# Patient Record
Sex: Female | Born: 1940 | Race: White | Hispanic: No | Marital: Married | Smoking: Never smoker
Health system: Southern US, Community
[De-identification: ages and names within clinical notes are randomized; demographics above are authoritative.]

## PROBLEM LIST (undated history)

## (undated) DIAGNOSIS — J45909 Unspecified asthma, uncomplicated: Secondary | ICD-10-CM

## (undated) DIAGNOSIS — E7800 Pure hypercholesterolemia, unspecified: Secondary | ICD-10-CM

## (undated) DIAGNOSIS — I340 Nonrheumatic mitral (valve) insufficiency: Secondary | ICD-10-CM

## (undated) DIAGNOSIS — I219 Acute myocardial infarction, unspecified: Secondary | ICD-10-CM

## (undated) DIAGNOSIS — I2510 Atherosclerotic heart disease of native coronary artery without angina pectoris: Secondary | ICD-10-CM

## (undated) DIAGNOSIS — M19019 Primary osteoarthritis, unspecified shoulder: Secondary | ICD-10-CM

## (undated) DIAGNOSIS — E1149 Type 2 diabetes mellitus with other diabetic neurological complication: Secondary | ICD-10-CM

## (undated) DIAGNOSIS — G479 Sleep disorder, unspecified: Secondary | ICD-10-CM

## (undated) DIAGNOSIS — F419 Anxiety disorder, unspecified: Secondary | ICD-10-CM

## (undated) DIAGNOSIS — N189 Chronic kidney disease, unspecified: Secondary | ICD-10-CM

## (undated) DIAGNOSIS — I5022 Chronic systolic (congestive) heart failure: Secondary | ICD-10-CM

## (undated) DIAGNOSIS — I63239 Cerebral infarction due to unspecified occlusion or stenosis of unspecified carotid arteries: Secondary | ICD-10-CM

## (undated) DIAGNOSIS — F0390 Unspecified dementia without behavioral disturbance: Secondary | ICD-10-CM

## (undated) DIAGNOSIS — I493 Ventricular premature depolarization: Secondary | ICD-10-CM

## (undated) DIAGNOSIS — I10 Essential (primary) hypertension: Secondary | ICD-10-CM

## (undated) HISTORY — DX: Pure hypercholesterolemia, unspecified: E78.00

## (undated) HISTORY — DX: Ventricular premature depolarization: I49.3

## (undated) HISTORY — DX: Primary osteoarthritis, unspecified shoulder: M19.019

## (undated) HISTORY — DX: Nonrheumatic mitral (valve) insufficiency: I34.0

## (undated) HISTORY — DX: Atherosclerotic heart disease of native coronary artery without angina pectoris: I25.10

## (undated) HISTORY — DX: Unspecified asthma, uncomplicated: J45.909

## (undated) HISTORY — DX: Acute myocardial infarction, unspecified: I21.9

## (undated) HISTORY — DX: Type 2 diabetes mellitus with other diabetic neurological complication: E11.49

## (undated) HISTORY — DX: Anxiety disorder, unspecified: F41.9

## (undated) HISTORY — DX: Essential (primary) hypertension: I10

## (undated) HISTORY — DX: Cerebral infarction due to unspecified occlusion or stenosis of unspecified carotid artery: I63.239

## (undated) HISTORY — DX: Sleep disorder, unspecified: G47.9

## (undated) HISTORY — DX: Chronic kidney disease, unspecified: N18.9

## (undated) HISTORY — PX: TONSILLECTOMY: SUR1361

## (undated) HISTORY — DX: Chronic systolic (congestive) heart failure: I50.22

## (undated) HISTORY — DX: Unspecified dementia, unspecified severity, without behavioral disturbance, psychotic disturbance, mood disturbance, and anxiety: F03.90

---

## 2001-10-13 HISTORY — PX: CARDIAC CATHETERIZATION: SHX172

## 2009-10-13 HISTORY — PX: CARDIAC CATHETERIZATION: SHX172

## 2010-10-13 HISTORY — PX: CARDIAC CATHETERIZATION: SHX172

## 2012-07-27 ENCOUNTER — Encounter: Payer: Self-pay | Admitting: Internal Medicine

## 2012-07-27 ENCOUNTER — Encounter: Payer: Self-pay | Admitting: Cardiology

## 2012-07-27 ENCOUNTER — Ambulatory Visit (INDEPENDENT_AMBULATORY_CARE_PROVIDER_SITE_OTHER): Payer: Medicare Other | Admitting: Internal Medicine

## 2012-07-27 VITALS — BP 131/71 | HR 63 | Ht 66.5 in | Wt 141.4 lb

## 2012-07-27 DIAGNOSIS — F0390 Unspecified dementia without behavioral disturbance: Secondary | ICD-10-CM

## 2012-07-27 DIAGNOSIS — I2510 Atherosclerotic heart disease of native coronary artery without angina pectoris: Secondary | ICD-10-CM | POA: Insufficient documentation

## 2012-07-27 DIAGNOSIS — F0280 Dementia in other diseases classified elsewhere without behavioral disturbance: Secondary | ICD-10-CM | POA: Insufficient documentation

## 2012-07-27 DIAGNOSIS — E119 Type 2 diabetes mellitus without complications: Secondary | ICD-10-CM

## 2012-07-27 MED ORDER — SPIRONOLACTONE 25 MG PO TABS: 12.5000 mg | ORAL_TABLET | Freq: Every day | ORAL | Status: DC

## 2012-07-27 NOTE — Assessment & Plan Note (Signed)
limitig

## 2012-07-27 NOTE — Assessment & Plan Note (Signed)
Stable on current medications. We will decrease her Aldactone from 25>>-12-1/2;; she is already decreased her aspirin to 81

## 2012-07-27 NOTE — Patient Instructions (Signed)
Reduce Aldactone (spironolactone) dose to 12.5mg  (1/2 tablet) daily.  Your physician wants you to follow-up in: 6 months with Dr. Graciela Husbands.  You will receive a reminder letter in the mail two months in advance. If you don't receive a letter, please call our office to schedule the follow-up appointment.

## 2012-07-27 NOTE — Progress Notes (Signed)
History and Physical  Patient ID: [name] Short MRN: [id number], SOB: 05-Jan-1941 71 y.o. Date of Encounter: 07/27/2012, 11:42 AM  Primary Physician: No primary provider on file. Primary Cardiologist New    History of Present Illness: [name] Short is a 71 y.o. female seen to establish cardiac care. She is a history of coronary artery disease with most recent catheterization 12/12 at bedtime with nonobstructive disease prior stenting to diagonal, LAD and posterolateral. These were undertaken apparently 2003 with drug-eluting stenting. Echocardiogram 9/12 demonstrated normal left ventricular function with mild LVH. A Myoview 4/12 had demonstrated "ejection fraction 29%" with abnormalities consistent inferoseptal and apical septal MI. These data are discordant from her echo  Other testing included an abdominal aortic ultrasound 5/12 demonstrated no aneurysm; renal artery ultrasound 9/12  She also has a history of diabetes hypertension with no increase in velocities; carotid ultrasound 5/12 and significant stenosis bilaterally (less than 15 on the right and less than 50 on the left.  She has significant dementia with short-term memory loss. Her husband answered her questions for her. He denies problems with chest pain shortness of breath or edema. She is able to climb a flight of stairs with only minimal difficulty.    Past Medical History  Diagnosis Date  . Hypercholesteremia   . CAD (coronary artery disease)prior stents LAD. Cx Diagonal 2003   . PVC (premature ventricular contraction)   . Occlusion and stenosis of carotid artery with cerebral infarction     <15% R <50% L 2012  . Dyspnea   . Hypertension   . Diabetes mellitus, type II   . Mitral valve disease   . Abnormal EKG   . Fatigue   . Dementia      Past Surgical History  Procedure Date  . Tonsillectomy   . Cardiac catheterization 2003  . Cardiac catheterization 2011  . Cardiac catheterization 2012      Current  Outpatient Prescriptions  Medication Sig Dispense Refill  . ALENDRONATE SODIUM PO Take 1 tablet by mouth once a week.      Marland Kitchen aspirin 81 MG tablet Take 81 mg by mouth daily.      Marland Kitchen atorvastatin (LIPITOR) 40 MG tablet Take 40 mg by mouth daily.      . isosorbide mononitrate (IMDUR) 60 MG 24 hr tablet Take 60 mg by mouth daily.      . memantine (NAMENDA) 10 MG tablet Take 10 mg by mouth 2 (two) times daily.      . metFORMIN (GLUCOPHAGE) 1000 MG tablet Take 1,000 mg by mouth 2 (two) times daily with a meal.      . Methylfol-Methylcob-Acetylcyst (CEREFOLIN NAC PO) Take 1 tablet by mouth daily.      . metoprolol (LOPRESSOR) 100 MG tablet Take 100 mg by mouth 2 (two) times daily.      Marland Kitchen olmesartan-hydrochlorothiazide (BENICAR HCT) 40-25 MG per tablet Take 1 tablet by mouth daily.      Marland Kitchen spironolactone (ALDACTONE) 25 MG tablet Take 25 mg by mouth daily.         Allergies: Allergies  Allergen Reactions  . Ciprofloxacin Rash     History  Substance Use Topics  . Smoking status: Never Smoker   . Smokeless tobacco: Never Used  . Alcohol Use: Yes     Wine occcassionally      Family History  Problem Relation Age of Onset  . Other Mother     Diabetes Mellitus  . Cancer Father   . Coronary artery disease  Other       ROS:  Please see the history of present illness.   All other systems reviewed and negative.   Vital Signs: Blood pressure 131/71, pulse 63, height 5' 6.5" (1.689 m), weight 141 lb 6.4 oz (64.139 kg).  PHYSICAL EXAM: General:  Well nourished, well developed female in no acute distress HEENT: normal Lymph: no adenopathy Neck: no JVD Endocrine:  No thryomegaly Vascular: No carotid bruits; FA pulses 2+ bilaterally without bruits Cardiac:  normal S1, S2; RRR; no murmur Back: without kyphosis/scoliosis, no CVA tenderness Lungs:  clear to auscultation bilaterally, no wheezing, rhonchi or rales Abd: soft, nontender, no hepatomegaly Ext: no edema Musculoskeletal:  No  deformities, BUE and BLE strength normal and equal Skin: warm and dry Neuro:  CNs 2-12 intact, no focal abnormalities noted Psych:  Normal affect   EKG:  Sinus rhythm at 64 intervals 20/14/45 left bundle branch block left axis deviation Labs:      ASSESSMENT AND PLAN:

## 2012-08-18 ENCOUNTER — Telehealth: Payer: Self-pay | Admitting: Internal Medicine

## 2012-08-18 NOTE — Telephone Encounter (Signed)
Pt needs new rx for spirolactone, dosage was reduced to 2.5 mg and tabs she has now are too small to cut in half, uses [facility] in [place]

## 2012-08-18 NOTE — Telephone Encounter (Signed)
Called the pharmacy, spoke with the pharmacist stated the lowest dose is 25 MG, and they are not allowed to split the pills for the pt.  He stated the pt should use a pill cutter, and if the pill cutter was older it may not be sharp enough to cut the pills.  Caralee Ates, CMA  Called the pt I spoke her husband and explained the lowest dose was 25 MG.  I asked how was they cutting the pill, he stated he they indeed was using a pill cutter, but it is a couple of years old.  He stated he was going to get a new pill cutter for his wife.  Caralee Ates, CMA

## 2012-08-24 ENCOUNTER — Telehealth: Payer: Self-pay | Admitting: Internal Medicine

## 2012-08-24 NOTE — Telephone Encounter (Signed)
New Problem:    Called in because the patient is scheduled to have a procedure on 09/30/12 and they need cardiac clearance and to request instructions about the patient's asprin.  Please fax to [phone]

## 2012-08-24 NOTE — Telephone Encounter (Signed)
Will forward to Dr. Klein. 

## 2012-09-06 NOTE — Telephone Encounter (Signed)
F/u   [name] Short from [facility] [phone] calling for pt cardiac clearance. (Second Request).  This information can be faxed to [phone], and procedure scheduled 09/30/12.

## 2012-09-06 NOTE — Telephone Encounter (Signed)
Will forward to Dr. Klein. 

## 2012-09-15 ENCOUNTER — Encounter: Payer: Self-pay | Admitting: *Deleted

## 2012-09-15 ENCOUNTER — Other Ambulatory Visit: Payer: Self-pay | Admitting: *Deleted

## 2012-09-15 NOTE — Telephone Encounter (Signed)
She should be good for surgery  ASA shiould be continued unless neurosurgery or ANBOSLUTELY needed to be stopped

## 2012-09-15 NOTE — Telephone Encounter (Signed)
Clearance letter sent

## 2012-12-13 ENCOUNTER — Telehealth: Payer: Self-pay | Admitting: Internal Medicine

## 2012-12-13 NOTE — Telephone Encounter (Signed)
Pt states she has been having intermittent chest pain for a couple of days now with bilateral shooting pains down both arms that last a "few" minutes. She has not experienced any chest pain today. She denies SOB, tingling, numbness, lightheadedness during the episodes.  Her BP/HR yesterday was: "okay". While on the phone she took her BP/HR with results of: 135/79 and 74.  Yesterday she did take a dose of NTG SL with positive result of pain subsiding shortly thereafter.  Strongly advised and repeated advisement that patient should been seen in ED with chest pain episode. Patient declined to go to ED. Reviewed importance of immediate assessment during times of active chest pain with both patient and her husband. Advised patient that Dr. Graciela Husbands is not in the office at this time.

## 2012-12-13 NOTE — Telephone Encounter (Signed)
New Problem:    Patient's husband called in because his wife is having intermittent chest pain with pain shooting down her left arm.  Patient last had the pain two days ago.  Please call back.

## 2012-12-14 ENCOUNTER — Ambulatory Visit (INDEPENDENT_AMBULATORY_CARE_PROVIDER_SITE_OTHER): Payer: Medicare Other | Admitting: Internal Medicine

## 2012-12-14 ENCOUNTER — Encounter: Payer: Self-pay | Admitting: Internal Medicine

## 2012-12-14 VITALS — BP 130/72 | HR 66 | Ht 66.0 in | Wt 153.0 lb

## 2012-12-14 MED ORDER — NITROGLYCERIN 0.4 MG SL SUBL: 0.4000 mg | SUBLINGUAL_TABLET | SUBLINGUAL | Status: DC | PRN

## 2012-12-14 MED ORDER — NAPROXEN SODIUM ER 375 MG PO TB24: ORAL_TABLET | ORAL | Status: DC

## 2012-12-14 NOTE — Assessment & Plan Note (Signed)
The patient's device was interrogated.  The information was reviewed. No changes were made in the programming.    

## 2012-12-14 NOTE — Assessment & Plan Note (Signed)
The patient has atypical chest pain manifested as a shooting discomfort into her arms. The duration is hard to identify given her dementia. With her high pretest probabilities given multiple stents and her previously negative, i.e. Nonischemic, Myoview in the context of a catheterization demonstrating no recurrent obstruction I think repeat Myoview scanning is our most effective diagnostic strategy.  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 We'll undertake a 2 week trial of naproxen.

## 2012-12-14 NOTE — Patient Instructions (Addendum)
Your physician wants you to follow-up in: 6 months with Dr. Graciela Husbands. You will receive a reminder letter in the mail two months in advance. If you don't receive a letter, please call our office to schedule the follow-up appointment.  Your physician has recommended you make the following change in your medication:  -start Naproxen 1 tablet daily x 2 weeks

## 2012-12-14 NOTE — Telephone Encounter (Signed)
I spoke with the patient's husband. She has not had complaint's of chest pain over the last 2 days. She has had some intermittent episodes that are concerning with her history. I have advised that the patient should be seen. She is scheduled for a follow up visit with Dr. Graciela Husbands at 3:45 pm today in [place].

## 2012-12-14 NOTE — Progress Notes (Signed)
Patient Care Team: Dorothey Baseman as PCP - General (Family Medicine)   HPI  [name] Short is a 72 y.o. female Seen in followup in the context of coronary artery disease. He has a history of prior stenting of her diagonal LAD and posterolateral branch  And underwent catheterization 12/12 at which point stents were patent. Echocardiogram 9/12 demonstrated normal left ventricular function and a Myoview 4/12 that suggested prior MI. Ejection fraction was discordant  She has significant dementia and so she is not able to will clarify her history. Her husband helps a great deal. She describes shooting pains in her arms. These are not associated with exertion but rather occur when she is getting ready to go to bed. Previous discomfort prior to her status as best as they can remember with a chest pressure; they do   not recall that was exertional.   Past Medical History  Diagnosis Date  . Hypercholesteremia   . CAD (coronary artery disease)prior stents LAD. Cx Diagonal 2003   . PVC (premature ventricular contraction)   . Occlusion and stenosis of carotid artery with cerebral infarction     <15% R <50% L 2012  . Dyspnea   . Hypertension   . Diabetes mellitus, type II   . Mitral valve disease   . Abnormal EKG   . Fatigue   . Dementia     Past Surgical History  Procedure Laterality Date  . Tonsillectomy    . Cardiac catheterization  2003  . Cardiac catheterization  2011  . Cardiac catheterization  2012    Current Outpatient Prescriptions  Medication Sig Dispense Refill  . aspirin 81 MG tablet Take 81 mg by mouth daily.      Marland Kitchen atorvastatin (LIPITOR) 40 MG tablet Take 40 mg by mouth daily.      Marland Kitchen donepezil (ARICEPT) 5 MG tablet Take 5 mg by mouth at bedtime.      . isosorbide mononitrate (IMDUR) 60 MG 24 hr tablet Take 60 mg by mouth daily.      . memantine (NAMENDA) 10 MG tablet Take 10 mg by mouth 2 (two) times daily.      . metFORMIN (GLUCOPHAGE) 1000 MG tablet Take 1,000 mg by  mouth 2 (two) times daily with a meal.      . metoprolol (LOPRESSOR) 100 MG tablet Take 100 mg by mouth 2 (two) times daily.      Marland Kitchen olmesartan-hydrochlorothiazide (BENICAR HCT) 40-25 MG per tablet Take 1 tablet by mouth daily.      Marland Kitchen spironolactone (ALDACTONE) 25 MG tablet Take 0.5 tablets (12.5 mg total) by mouth daily.  30 tablet  6   No current facility-administered medications for this visit.    Allergies  Allergen Reactions  . Ciprofloxacin Rash    Review of Systems negative except from HPI and PMH  Physical Exam BP 130/72  Pulse 66  Ht 5\' 6"  (1.676 m)  Wt 153 lb (69.4 kg)  BMI 24.71 kg/m2 Well developed and well nourished in no acute distress HENT normal E scleral and icterus clear Neck Supple JVP flat; carotids brisk and full Clear to ausculation Regular rate and rhythm, no murmurs gallops or rub Soft with active bowel sounds No clubbing cyanosis none Edema Alert  , grossly normal motor and sensory function Skin Warm and Dry  ECG demonstrates sinus rhythm at 60 intervals 18/15/45 Left bundle branchlike pattern but with a fragmented QRS in lead 1, L. And  An  RS pattern in V6  Assessment and  Plan

## 2012-12-20 ENCOUNTER — Ambulatory Visit: Payer: Self-pay | Admitting: Internal Medicine

## 2012-12-20 DIAGNOSIS — R079 Chest pain, unspecified: Secondary | ICD-10-CM

## 2012-12-21 ENCOUNTER — Other Ambulatory Visit: Payer: Self-pay

## 2012-12-21 DIAGNOSIS — R079 Chest pain, unspecified: Secondary | ICD-10-CM

## 2013-04-20 ENCOUNTER — Ambulatory Visit: Payer: Self-pay | Admitting: Family Medicine

## 2013-04-27 ENCOUNTER — Inpatient Hospital Stay: Payer: Self-pay | Admitting: Specialist

## 2013-04-27 LAB — SODIUM
Sodium: 109 mmol/L — CL (ref 136–145)
Sodium: 111 mmol/L — CL (ref 136–145)
Sodium: 114 mmol/L — CL (ref 136–145)
Sodium: 118 mmol/L — CL (ref 136–145)

## 2013-04-27 LAB — URINALYSIS, COMPLETE
Bilirubin,UR: NEGATIVE
Glucose,UR: NEGATIVE mg/dL (ref 0–75)
Nitrite: NEGATIVE
Specific Gravity: 1.017 (ref 1.003–1.030)
Squamous Epithelial: NONE SEEN

## 2013-04-27 LAB — COMPREHENSIVE METABOLIC PANEL
Albumin: 3.3 g/dL — ABNORMAL LOW (ref 3.4–5.0)
Alkaline Phosphatase: 76 U/L (ref 50–136)
Bilirubin,Total: 1 mg/dL (ref 0.2–1.0)
Calcium, Total: 8.4 mg/dL — ABNORMAL LOW (ref 8.5–10.1)
Chloride: 71 mmol/L — ABNORMAL LOW (ref 98–107)
Creatinine: 1.03 mg/dL (ref 0.60–1.30)
EGFR (Non-African Amer.): 55 — ABNORMAL LOW
Glucose: 150 mg/dL — ABNORMAL HIGH (ref 65–99)
SGOT(AST): 19 U/L (ref 15–37)
SGPT (ALT): 19 U/L (ref 12–78)
Sodium: 107 mmol/L — CL (ref 136–145)
Total Protein: 6.2 g/dL — ABNORMAL LOW (ref 6.4–8.2)

## 2013-04-27 LAB — PROTIME-INR: Prothrombin Time: 13.5 secs (ref 11.5–14.7)

## 2013-04-27 LAB — CBC
HCT: 34.3 % — ABNORMAL LOW (ref 35.0–47.0)
HGB: 12.2 g/dL (ref 12.0–16.0)
MCHC: 35.6 g/dL (ref 32.0–36.0)
MCV: 77 fL — ABNORMAL LOW (ref 80–100)
RDW: 14.3 % (ref 11.5–14.5)

## 2013-04-27 LAB — URIC ACID: Uric Acid: 5.4 mg/dL (ref 2.6–6.0)

## 2013-04-27 LAB — TROPONIN I: Troponin-I: 0.07 ng/mL — ABNORMAL HIGH

## 2013-04-27 LAB — MAGNESIUM: Magnesium: 1.5 mg/dL — ABNORMAL LOW

## 2013-04-28 LAB — CBC WITH DIFFERENTIAL/PLATELET
Eosinophil #: 0 10*3/uL (ref 0.0–0.7)
Eosinophil %: 0.6 %
HCT: 32.1 % — ABNORMAL LOW (ref 35.0–47.0)
Lymphocyte #: 1.1 10*3/uL (ref 1.0–3.6)
MCHC: 36.2 g/dL — ABNORMAL HIGH (ref 32.0–36.0)
MCV: 78 fL — ABNORMAL LOW (ref 80–100)
Monocyte #: 0.7 x10 3/mm (ref 0.2–0.9)
Monocyte %: 8.1 %
Neutrophil #: 6.5 10*3/uL (ref 1.4–6.5)
Neutrophil %: 77.6 %
Platelet: 206 10*3/uL (ref 150–440)
RBC: 4.11 10*6/uL (ref 3.80–5.20)
RDW: 14.4 % (ref 11.5–14.5)
WBC: 8.4 10*3/uL (ref 3.6–11.0)

## 2013-04-28 LAB — BASIC METABOLIC PANEL
Anion Gap: 5 — ABNORMAL LOW (ref 7–16)
BUN: 16 mg/dL (ref 7–18)
Calcium, Total: 8.2 mg/dL — ABNORMAL LOW (ref 8.5–10.1)
Chloride: 87 mmol/L — ABNORMAL LOW (ref 98–107)
Co2: 29 mmol/L (ref 21–32)
Creatinine: 0.97 mg/dL (ref 0.60–1.30)
EGFR (African American): >60
EGFR (Non-African Amer.): 59 — ABNORMAL LOW
Glucose: 91 mg/dL (ref 65–99)
Osmolality: 245 (ref 275–301)
Potassium: 3 mmol/L — ABNORMAL LOW (ref 3.5–5.1)

## 2013-04-28 LAB — PROTEIN / CREATININE RATIO, URINE
Creatinine, Urine: 15.9 mg/dL — ABNORMAL LOW (ref 30.0–125.0)
Protein, Random Urine: <5 mg/dL — ABNORMAL LOW (ref 0–12)

## 2013-04-28 LAB — SODIUM, URINE, RANDOM
Sodium, Urine Random: 30 mmol/L (ref 20–110)
Sodium, Urine Random: 53 mmol/L (ref 20–110)

## 2013-04-28 LAB — SODIUM
Sodium: 119 mmol/L — CL (ref 136–145)
Sodium: 121 mmol/L — ABNORMAL LOW (ref 136–145)
Sodium: 121 mmol/L — ABNORMAL LOW (ref 136–145)
Sodium: 121 mmol/L — ABNORMAL LOW (ref 136–145)
Sodium: 121 mmol/L — ABNORMAL LOW (ref 136–145)
Sodium: 122 mmol/L — ABNORMAL LOW (ref 136–145)

## 2013-04-28 LAB — CHLORIDE, URINE, RANDOM: Chloride, Urine Random: 27 mmol/L — ABNORMAL LOW (ref 55–125)

## 2013-04-28 LAB — OSMOLALITY, URINE: Osmolality: 135 mOsm/kg

## 2013-04-28 LAB — MAGNESIUM: Magnesium: 2.1 mg/dL

## 2013-04-28 LAB — TROPONIN I: Troponin-I: 0.07 ng/mL — ABNORMAL HIGH

## 2013-04-29 LAB — CBC WITH DIFFERENTIAL/PLATELET
Basophil #: 0 10*3/uL (ref 0.0–0.1)
HGB: 9.7 g/dL — ABNORMAL LOW (ref 12.0–16.0)
Lymphocyte #: 0.8 10*3/uL — ABNORMAL LOW (ref 1.0–3.6)
MCH: 26.3 pg (ref 26.0–34.0)
Monocyte #: 0.8 x10 3/mm (ref 0.2–0.9)
Monocyte %: 6.5 %
Neutrophil #: 10.2 10*3/uL — ABNORMAL HIGH (ref 1.4–6.5)
Neutrophil %: 85.8 %
Platelet: 197 10*3/uL (ref 150–440)
RBC: 3.67 10*6/uL — ABNORMAL LOW (ref 3.80–5.20)
WBC: 11.9 10*3/uL — ABNORMAL HIGH (ref 3.6–11.0)

## 2013-04-29 LAB — SODIUM, URINE, RANDOM: Sodium, Urine Random: 29 mmol/L (ref 20–110)

## 2013-04-29 LAB — BASIC METABOLIC PANEL
Anion Gap: 3 — ABNORMAL LOW (ref 7–16)
BUN: 17 mg/dL (ref 7–18)
Calcium, Total: 8 mg/dL — ABNORMAL LOW (ref 8.5–10.1)
Creatinine: 1.07 mg/dL (ref 0.60–1.30)
Glucose: 119 mg/dL — ABNORMAL HIGH (ref 65–99)
Osmolality: 254 (ref 275–301)
Potassium: 3.9 mmol/L (ref 3.5–5.1)
Sodium: 125 mmol/L — ABNORMAL LOW (ref 136–145)

## 2013-04-29 LAB — SODIUM
Sodium: 125 mmol/L — ABNORMAL LOW (ref 136–145)
Sodium: 126 mmol/L — ABNORMAL LOW (ref 136–145)
Sodium: 127 mmol/L — ABNORMAL LOW (ref 136–145)
Sodium: 127 mmol/L — ABNORMAL LOW (ref 136–145)
Sodium: 128 mmol/L — ABNORMAL LOW (ref 136–145)

## 2013-04-29 LAB — CHLORIDE, URINE, RANDOM: Chloride, Urine Random: 24 mmol/L — ABNORMAL LOW (ref 55–125)

## 2013-04-29 LAB — KAPPA/LAMBDA FREE LIGHT CHAINS (ARMC)

## 2013-04-30 LAB — CBC WITH DIFFERENTIAL/PLATELET
Basophil #: 0.1 10*3/uL (ref 0.0–0.1)
Eosinophil %: 1.9 %
HCT: 30 % — ABNORMAL LOW (ref 35.0–47.0)
Lymphocyte #: 1.1 10*3/uL (ref 1.0–3.6)
MCH: 28 pg (ref 26.0–34.0)
MCHC: 34.2 g/dL (ref 32.0–36.0)
Monocyte #: 0.7 x10 3/mm (ref 0.2–0.9)
Monocyte %: 6.8 %
Neutrophil #: 8.9 10*3/uL — ABNORMAL HIGH (ref 1.4–6.5)
Platelet: 200 10*3/uL (ref 150–440)
RBC: 3.67 10*6/uL — ABNORMAL LOW (ref 3.80–5.20)
WBC: 11 10*3/uL (ref 3.6–11.0)

## 2013-04-30 LAB — BASIC METABOLIC PANEL
Anion Gap: 3 — ABNORMAL LOW (ref 7–16)
BUN: 14 mg/dL (ref 7–18)
Calcium, Total: 8.5 mg/dL (ref 8.5–10.1)
Co2: 28 mmol/L (ref 21–32)
Creatinine: 1.08 mg/dL (ref 0.60–1.30)
EGFR (Non-African Amer.): 52 — ABNORMAL LOW
Osmolality: 262 (ref 275–301)
Potassium: 4.3 mmol/L (ref 3.5–5.1)
Sodium: 130 mmol/L — ABNORMAL LOW (ref 136–145)

## 2013-05-01 LAB — RENAL FUNCTION PANEL
Anion Gap: 4 — ABNORMAL LOW (ref 7–16)
Chloride: 101 mmol/L (ref 98–107)
Potassium: 4.4 mmol/L (ref 3.5–5.1)
Sodium: 132 mmol/L — ABNORMAL LOW (ref 136–145)

## 2013-05-03 DIAGNOSIS — I2510 Atherosclerotic heart disease of native coronary artery without angina pectoris: Secondary | ICD-10-CM

## 2013-05-03 DIAGNOSIS — E871 Hypo-osmolality and hyponatremia: Secondary | ICD-10-CM

## 2013-05-03 DIAGNOSIS — F411 Generalized anxiety disorder: Secondary | ICD-10-CM

## 2013-05-03 DIAGNOSIS — F0390 Unspecified dementia without behavioral disturbance: Secondary | ICD-10-CM

## 2013-05-03 DIAGNOSIS — F22 Delusional disorders: Secondary | ICD-10-CM

## 2013-05-13 ENCOUNTER — Ambulatory Visit: Payer: Self-pay | Admitting: Internal Medicine

## 2013-05-13 DIAGNOSIS — I5022 Chronic systolic (congestive) heart failure: Secondary | ICD-10-CM

## 2013-05-13 HISTORY — DX: Chronic systolic (congestive) heart failure: I50.22

## 2013-05-30 ENCOUNTER — Inpatient Hospital Stay: Payer: Self-pay | Admitting: Internal Medicine

## 2013-05-30 DIAGNOSIS — R079 Chest pain, unspecified: Secondary | ICD-10-CM

## 2013-05-30 DIAGNOSIS — I214 Non-ST elevation (NSTEMI) myocardial infarction: Secondary | ICD-10-CM

## 2013-05-30 LAB — CBC
HGB: 10.5 g/dL — ABNORMAL LOW (ref 12.0–16.0)
MCH: 28.1 pg (ref 26.0–34.0)
Platelet: 186 10*3/uL (ref 150–440)
RBC: 3.75 10*6/uL — ABNORMAL LOW (ref 3.80–5.20)

## 2013-05-30 LAB — CK TOTAL AND CKMB (NOT AT ARMC)
CK, Total: 48 U/L (ref 21–215)
CK, Total: 36 U/L (ref 21–215)
CK-MB: 0.7 ng/mL (ref 0.5–3.6)

## 2013-05-30 LAB — BASIC METABOLIC PANEL
Anion Gap: 5 — ABNORMAL LOW (ref 7–16)
BUN: 14 mg/dL (ref 7–18)
Calcium, Total: 9.1 mg/dL (ref 8.5–10.1)
Co2: 27 mmol/L (ref 21–32)
Creatinine: 1.07 mg/dL (ref 0.60–1.30)
EGFR (Non-African Amer.): 52 — ABNORMAL LOW
Glucose: 143 mg/dL — ABNORMAL HIGH (ref 65–99)
Osmolality: 280 (ref 275–301)

## 2013-05-30 LAB — TROPONIN I
Troponin-I: 0.16 ng/mL — ABNORMAL HIGH
Troponin-I: 0.15 ng/mL — ABNORMAL HIGH

## 2013-05-30 LAB — PROTIME-INR: INR: 1.1

## 2013-05-30 LAB — APTT
Activated PTT: 93.5 secs — ABNORMAL HIGH (ref 23.6–35.9)
Activated PTT: 80.7 secs — ABNORMAL HIGH (ref 23.6–35.9)

## 2013-05-31 ENCOUNTER — Telehealth: Payer: Self-pay

## 2013-05-31 ENCOUNTER — Telehealth: Payer: Self-pay | Admitting: Internal Medicine

## 2013-05-31 LAB — BASIC METABOLIC PANEL
Chloride: 100 mmol/L (ref 98–107)
Co2: 28 mmol/L (ref 21–32)
Creatinine: 1.06 mg/dL (ref 0.60–1.30)
EGFR (Non-African Amer.): 52 — ABNORMAL LOW
Glucose: 140 mg/dL — ABNORMAL HIGH (ref 65–99)
Potassium: 3.5 mmol/L (ref 3.5–5.1)
Sodium: 135 mmol/L — ABNORMAL LOW (ref 136–145)

## 2013-05-31 LAB — CBC WITH DIFFERENTIAL/PLATELET
Basophil %: 0.6 %
HCT: 30.2 % — ABNORMAL LOW (ref 35.0–47.0)
Lymphocyte %: 15.7 %
MCH: 28.4 pg (ref 26.0–34.0)
MCHC: 34.5 g/dL (ref 32.0–36.0)
MCV: 82 fL (ref 80–100)
Monocyte #: 0.7 x10 3/mm (ref 0.2–0.9)
Platelet: 192 10*3/uL (ref 150–440)
RBC: 3.66 10*6/uL — ABNORMAL LOW (ref 3.80–5.20)
RDW: 16 % — ABNORMAL HIGH (ref 11.5–14.5)
WBC: 10.8 10*3/uL (ref 3.6–11.0)

## 2013-05-31 LAB — LIPID PANEL
Cholesterol: 114 mg/dL (ref 0–200)
HDL Cholesterol: 47 mg/dL (ref 40–60)
Ldl Cholesterol, Calc: 47 mg/dL (ref 0–100)

## 2013-05-31 LAB — APTT: Activated PTT: 81.9 secs — ABNORMAL HIGH (ref 23.6–35.9)

## 2013-05-31 NOTE — Telephone Encounter (Signed)
Spoke with French Ana at [facility] PT and pt is now been admitted and the order is not needed.

## 2013-05-31 NOTE — Telephone Encounter (Signed)
She had had encephalopathy when I met her at [facility]. I have not seen her since her discharge (though she may have a pending appt) They will need to reevaluate based on what is happening in the hospital now

## 2013-05-31 NOTE — Telephone Encounter (Signed)
Message copied by Marilynne Halsted on Tue May 31, 2013  9:41 AM ------      Message from: Coralee Rud      Created: Tue May 31, 2013  8:04 AM      Regarding: tcm/ph       06/17/2013 at 3:45 Dr Kirke Corin ------

## 2013-05-31 NOTE — Telephone Encounter (Signed)
[name] Short with [facility] PT left v/m; [facility] gave order to [facility] for PT and OT with no diagnosis. [name] Short request faxed order fax # [phone] for PT and OT with diagnosis. [name] Short has tried to contact [employer] with no response back. [name] Short thinks pts husband called and said pt had another stroke or heart attack(Nancy was not sure which). I called [facility] and pt has been admitted to Rm 259.Please advise.

## 2013-06-01 ENCOUNTER — Encounter: Payer: Self-pay | Admitting: Internal Medicine

## 2013-06-01 DIAGNOSIS — E871 Hypo-osmolality and hyponatremia: Secondary | ICD-10-CM | POA: Insufficient documentation

## 2013-06-01 DIAGNOSIS — E1149 Type 2 diabetes mellitus with other diabetic neurological complication: Secondary | ICD-10-CM | POA: Insufficient documentation

## 2013-06-01 DIAGNOSIS — M19019 Primary osteoarthritis, unspecified shoulder: Secondary | ICD-10-CM | POA: Insufficient documentation

## 2013-06-01 DIAGNOSIS — F419 Anxiety disorder, unspecified: Secondary | ICD-10-CM | POA: Insufficient documentation

## 2013-06-01 DIAGNOSIS — G479 Sleep disorder, unspecified: Secondary | ICD-10-CM | POA: Insufficient documentation

## 2013-06-02 ENCOUNTER — Encounter: Payer: Self-pay | Admitting: Internal Medicine

## 2013-06-02 ENCOUNTER — Ambulatory Visit (INDEPENDENT_AMBULATORY_CARE_PROVIDER_SITE_OTHER): Payer: Medicare Other | Admitting: Internal Medicine

## 2013-06-02 VITALS — BP 110/70 | HR 69 | Temp 97.9°F | Wt 139.0 lb

## 2013-06-02 DIAGNOSIS — F419 Anxiety disorder, unspecified: Secondary | ICD-10-CM

## 2013-06-02 DIAGNOSIS — F411 Generalized anxiety disorder: Secondary | ICD-10-CM

## 2013-06-02 DIAGNOSIS — F0390 Unspecified dementia without behavioral disturbance: Secondary | ICD-10-CM

## 2013-06-02 DIAGNOSIS — E1149 Type 2 diabetes mellitus with other diabetic neurological complication: Secondary | ICD-10-CM

## 2013-06-02 DIAGNOSIS — I2510 Atherosclerotic heart disease of native coronary artery without angina pectoris: Secondary | ICD-10-CM

## 2013-06-02 NOTE — Assessment & Plan Note (Signed)
Will check A1c Asked them to check sugars occasionally

## 2013-06-02 NOTE — Patient Instructions (Signed)
Please check a fasting blood sugar once or twice a week

## 2013-06-02 NOTE — Assessment & Plan Note (Signed)
Coronary syndrome at [facility] earlier this week Records not available No MI since sent home next day Has follow up with Dr Kirke Corin Now on clopidorgrel

## 2013-06-02 NOTE — Progress Notes (Signed)
Subjective:    Patient ID: [name] Short, female    DOB: Mar 10, 1941, 72 y.o.   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 aspirin 81 MG tablet Take 81 mg by mouth daily.      Marland Kitchen atorvastatin (LIPITOR) 40 MG tablet Take 40 mg by mouth daily.      Marland Kitchen donepezil (ARICEPT) 5 MG tablet Take 5 mg by mouth at bedtime.      . memantine (NAMENDA) 10 MG tablet Take 10 mg by mouth 2 (two) times daily.      . metFORMIN (GLUCOPHAGE) 1000 MG tablet Take 1,000 mg by mouth 2 (two) times daily with a meal.      . nitroGLYCERIN (NITROSTAT) 0.4 MG SL tablet Place 1 tablet (0.4 mg total) under the tongue every 5 (five) minutes as needed for chest pain.  90 tablet  3   No current facility-administered medications on file prior to visit.    Allergies  Allergen Reactions  . Ciprofloxacin Rash    Past Medical History  Diagnosis Date  . Hypercholesteremia   . CAD (coronary artery disease)prior stents LAD. Cx Diagonal 2003   . PVC (premature ventricular contraction)   . Occlusion and stenosis of carotid artery with cerebral infarction     <15% R <50% L 2012  . Dyspnea   . Hypertension   . Mitral valve disease   . Abnormal EKG   .  Dementia   . Type II or unspecified type diabetes mellitus with neurological manifestations, not stated as uncontrolled(250.60)   . Sleep disturbance, unspecified   . Anxiety   . Osteoarthritis, shoulder     Past Surgical History  Procedure Laterality Date  . Tonsillectomy    . Cardiac catheterization  2003  . Cardiac catheterization  2011  . Cardiac catheterization  2012    Family History  Problem Relation Age of Onset  . Other Mother     Diabetes Mellitus  . Cancer Father   . Coronary artery disease Other   . Alzheimer's disease Sister   . Heart disease Brother   . Cancer Brother     brain tumor    History   Social History  . Marital Status: Married    Spouse Name: N/A    Number of Children: 3  . Years of Education: N/A   Occupational History  . Not on file.   Social History Main Topics  . Smoking status: Never Smoker   . Smokeless tobacco: Never Used  . Alcohol Use: Yes     Comment: Wine occcassionally  . Drug Use: No  . Sexual Activity: Not on file   Other Topics Concern  . Not on file   Social History Narrative   Has living  will   Daughter Victorino Dike is health care POA   Has DNR order   Review of Systems No abnormal bleeding Appetite is fine Weight is up a few pounds since last month    Objective:   Physical Exam  Constitutional: She appears well-developed and well-nourished. No distress.  Neck: Normal range of motion. Neck supple. No thyromegaly present.  Cardiovascular: Normal rate, regular rhythm, normal heart sounds and intact distal pulses.  Exam reveals no gallop.   No murmur heard. Pulmonary/Chest: Effort normal and breath sounds normal. No respiratory distress. She has no wheezes. She has no rales.  Musculoskeletal: She exhibits edema. She exhibits no tenderness.  Trace edema in ankles  Lymphadenopathy:    She has no cervical adenopathy.  Neurological:  Normal sensation in feet  Skin: No rash noted.  No foot lesions  Psychiatric:  She has a normal mood and affect. Her behavior is normal.          Assessment & Plan:

## 2013-06-02 NOTE — Assessment & Plan Note (Signed)
Mild Has home care for her and husband

## 2013-06-02 NOTE — Assessment & Plan Note (Signed)
Has been okay on the citalopram

## 2013-06-03 LAB — BASIC METABOLIC PANEL
CO2: 28 mEq/L (ref 19–32)
Calcium: 9.1 mg/dL (ref 8.4–10.5)
Creatinine, Ser: 1.4 mg/dL — ABNORMAL HIGH (ref 0.4–1.2)
Glucose, Bld: 98 mg/dL (ref 70–99)

## 2013-06-03 LAB — HEMOGLOBIN A1C: Hgb A1c MFr Bld: 7.2 % — ABNORMAL HIGH (ref 4.6–6.5)

## 2013-06-06 ENCOUNTER — Encounter: Payer: Self-pay | Admitting: *Deleted

## 2013-06-07 ENCOUNTER — Encounter: Payer: Self-pay | Admitting: *Deleted

## 2013-06-07 ENCOUNTER — Inpatient Hospital Stay: Payer: Self-pay | Admitting: Internal Medicine

## 2013-06-07 DIAGNOSIS — I059 Rheumatic mitral valve disease, unspecified: Secondary | ICD-10-CM

## 2013-06-07 LAB — COMPREHENSIVE METABOLIC PANEL
Albumin: 3.2 g/dL — ABNORMAL LOW (ref 3.4–5.0)
Alkaline Phosphatase: 109 U/L (ref 50–136)
Anion Gap: 11 (ref 7–16)
BUN: 25 mg/dL — ABNORMAL HIGH (ref 7–18)
Bilirubin,Total: 0.8 mg/dL (ref 0.2–1.0)
Calcium, Total: 9 mg/dL (ref 8.5–10.1)
Chloride: 87 mmol/L — ABNORMAL LOW (ref 98–107)
Glucose: 239 mg/dL — ABNORMAL HIGH (ref 65–99)
Osmolality: 249 (ref 275–301)
SGOT(AST): 23 U/L (ref 15–37)
SGPT (ALT): 25 U/L (ref 12–78)

## 2013-06-07 LAB — CBC
MCH: 28.1 pg (ref 26.0–34.0)
MCHC: 33.2 g/dL (ref 32.0–36.0)
MCV: 85 fL (ref 80–100)
Platelet: 359 10*3/uL (ref 150–440)
RBC: 3.9 10*6/uL (ref 3.80–5.20)
RDW: 15.4 % — ABNORMAL HIGH (ref 11.5–14.5)
WBC: 15 10*3/uL — ABNORMAL HIGH (ref 3.6–11.0)

## 2013-06-07 LAB — URINALYSIS, COMPLETE
Blood: NEGATIVE
Glucose,UR: 50 mg/dL (ref 0–75)
Hyaline Cast: 8
Nitrite: NEGATIVE
Protein: 100
RBC,UR: 1 /HPF (ref 0–5)
Specific Gravity: 1.02 (ref 1.003–1.030)
Squamous Epithelial: NONE SEEN
WBC UR: 25 /HPF (ref 0–5)

## 2013-06-07 LAB — PRO B NATRIURETIC PEPTIDE: B-Type Natriuretic Peptide: 20620 pg/mL — ABNORMAL HIGH (ref 0–125)

## 2013-06-07 LAB — OSMOLALITY: Osmolality: 258 mOsm/kg — ABNORMAL LOW (ref 280–301)

## 2013-06-07 LAB — CK TOTAL AND CKMB (NOT AT ARMC): CK, Total: 58 U/L (ref 21–215)

## 2013-06-08 ENCOUNTER — Telehealth: Payer: Self-pay

## 2013-06-08 LAB — CBC WITH DIFFERENTIAL/PLATELET
Basophil #: 0 10*3/uL (ref 0.0–0.1)
Eosinophil #: 0 10*3/uL (ref 0.0–0.7)
Eosinophil %: 0.2 %
HGB: 9.6 g/dL — ABNORMAL LOW (ref 12.0–16.0)
Lymphocyte #: 1.1 10*3/uL (ref 1.0–3.6)
MCHC: 35.1 g/dL (ref 32.0–36.0)
MCV: 82 fL (ref 80–100)
Monocyte #: 0.7 x10 3/mm (ref 0.2–0.9)
Neutrophil %: 79.9 %
RDW: 15.2 % — ABNORMAL HIGH (ref 11.5–14.5)

## 2013-06-08 LAB — BASIC METABOLIC PANEL
BUN: 23 mg/dL — ABNORMAL HIGH (ref 7–18)
Calcium, Total: 8.7 mg/dL (ref 8.5–10.1)
Chloride: 88 mmol/L — ABNORMAL LOW (ref 98–107)
Co2: 25 mmol/L (ref 21–32)
EGFR (African American): >60
EGFR (Non-African Amer.): 54 — ABNORMAL LOW
Glucose: 109 mg/dL — ABNORMAL HIGH (ref 65–99)

## 2013-06-08 NOTE — Telephone Encounter (Signed)
Message copied by Marilynne Halsted on Wed Jun 08, 2013  9:45 AM ------      Message from: Lorine Bears A      Created: Tue Jun 07, 2013  5:25 PM       Let he daughter know that the heart attack was small. The patient can resume regular activities as tolerated.                   ----- Message -----         From: Karie Schwalbe, MD         Sent: 06/02/2013   3:36 PM           To: Iran Ouch, MD            Muhammed,      Can you call daughter to give recommendations for activity level pending your upcoming appt?      I couldn't tell how serious her cardiac event was      Rich       ------

## 2013-06-08 NOTE — Telephone Encounter (Signed)
Spoke w/ pt's daughter.  Pt had an exacerbation of CHF and is in [facility].

## 2013-06-09 LAB — BASIC METABOLIC PANEL
BUN: 24 mg/dL — ABNORMAL HIGH (ref 7–18)
Calcium, Total: 8.9 mg/dL (ref 8.5–10.1)
Co2: 29 mmol/L (ref 21–32)
EGFR (Non-African Amer.): 49 — ABNORMAL LOW
Glucose: 102 mg/dL — ABNORMAL HIGH (ref 65–99)
Osmolality: 271 (ref 275–301)
Potassium: 3.8 mmol/L (ref 3.5–5.1)
Sodium: 133 mmol/L — ABNORMAL LOW (ref 136–145)

## 2013-06-09 LAB — SODIUM: Sodium: 134 mmol/L — ABNORMAL LOW (ref 136–145)

## 2013-06-13 ENCOUNTER — Ambulatory Visit: Payer: Self-pay | Admitting: Internal Medicine

## 2013-06-14 LAB — OSMOLALITY, URINE: Osmolality: 83 mOsm/kg

## 2013-06-17 ENCOUNTER — Ambulatory Visit (INDEPENDENT_AMBULATORY_CARE_PROVIDER_SITE_OTHER): Payer: Medicare Other | Admitting: Cardiovascular Disease

## 2013-06-17 ENCOUNTER — Encounter: Payer: Self-pay | Admitting: Cardiovascular Disease

## 2013-06-17 VITALS — BP 119/75 | HR 79 | Ht 66.0 in | Wt 132.0 lb

## 2013-06-17 DIAGNOSIS — R079 Chest pain, unspecified: Secondary | ICD-10-CM

## 2013-06-17 DIAGNOSIS — I5032 Chronic diastolic (congestive) heart failure: Secondary | ICD-10-CM

## 2013-06-17 DIAGNOSIS — E871 Hypo-osmolality and hyponatremia: Secondary | ICD-10-CM

## 2013-06-17 DIAGNOSIS — I5022 Chronic systolic (congestive) heart failure: Secondary | ICD-10-CM

## 2013-06-17 DIAGNOSIS — I2510 Atherosclerotic heart disease of native coronary artery without angina pectoris: Secondary | ICD-10-CM

## 2013-06-17 NOTE — Assessment & Plan Note (Signed)
Avoid thiazide diuretics .

## 2013-06-17 NOTE — Assessment & Plan Note (Signed)
The patient appears to be euvolemic. Recent ejection fraction was 25-30%. I instructed him to continue monitoring weight daily and give the dose of Lasix if needed. The daughter reports about 6 pounds of weight loss since hospital discharge. Due to that, I will check basic metabolic profile especially with previous history of hyponatremia. Continue treatment with spironolactone, lisinopril and metoprolol.

## 2013-06-17 NOTE — Assessment & Plan Note (Signed)
Continue medical therapy. Avoid invasive procedures due to advanced dementia. Continue dual antiplatelet therapy with aspirin and Plavix due to a recent small non-ST elevation myocardial MI.

## 2013-06-17 NOTE — Patient Instructions (Addendum)
Continue same medications.  You can use addition dose of Lasix (Furosemide) if weight increases more than 3 lbs in 48 hours or 5 lbs in 1 week.   Labs today.   Follow up in 3 months.

## 2013-06-17 NOTE — Progress Notes (Signed)
HPI  This is a 72 year old female who is here today for a followup visit after recent hospitalization. She has known history of coronary artery disease with prior stenting of her diagonal LAD and posterolateral branch. Cardiac catheterization in 12/12 showed patent stents .  Echocardiogram 9/12 demonstrated normal left ventricular function . Myoview 4/14  showed evidence of prior anterior MI with ejection fraction of 39%. The patient suffers from severe dementia. She was hospitalized in July for hyponatremia . She another hospitalization in August for a small non-ST elevation myocardial infarction. She was treated medically. She presented one week after that with confusion and severe hyponatremia with a sodium of 117. She was also noted to be fluid overloaded. Echocardiogram showed an ejection fraction of 25-30% with moderate to severe mitral regurgitation. She was treated with Tolvaptan . Hydrochlorothiazide was discontinued.  Since hospital discharge, she has been feeling better. She is not able to provide history due to advanced dementia. History is obtained with the help of her daughter.   Allergies  Allergen Reactions  . Ciprofloxacin Rash     Current Outpatient Prescriptions on File Prior to Visit  Medication Sig Dispense Refill  . aspirin 81 MG tablet Take 81 mg by mouth daily.      Marland Kitchen atorvastatin (LIPITOR) 40 MG tablet Take 40 mg by mouth daily.      . clopidogrel (PLAVIX) 75 MG tablet Take 75 mg by mouth daily.       Marland Kitchen donepezil (ARICEPT) 5 MG tablet Take 5 mg by mouth at bedtime.      . memantine (NAMENDA) 10 MG tablet Take 10 mg by mouth 2 (two) times daily.      . metFORMIN (GLUCOPHAGE) 1000 MG tablet Take 500 mg by mouth 2 (two) times daily with a meal.       . metoprolol (LOPRESSOR) 100 MG tablet Take 25 mg by mouth 2 (two) times daily.       . nitroGLYCERIN (NITROSTAT) 0.4 MG SL tablet Place 1 tablet (0.4 mg total) under the tongue every 5 (five) minutes as needed for chest  pain.  90 tablet  3  . spironolactone (ALDACTONE) 25 MG tablet Take 25 mg by mouth daily.        No current facility-administered medications on file prior to visit.     Past Medical History  Diagnosis Date  . Hypercholesteremia   . CAD (coronary artery disease)prior stents LAD. Cx Diagonal 2003   . Occlusion and stenosis of carotid artery with cerebral infarction     <15% R <50% L 2012  . Dyspnea   . Hypertension   . Mitral valve disease   . Abnormal EKG   . Dementia   . Type II or unspecified type diabetes mellitus with neurological manifestations, not stated as uncontrolled(250.60)   . Sleep disturbance, unspecified   . Anxiety   . Osteoarthritis, shoulder   . Chronic kidney disease   . MI (myocardial infarction)     x3  . Asthma     hx  . PVC (premature ventricular contraction)   . Chronic systolic heart failure 05/2013    EF 25-30% with moderate to severe mitral regurgitation     Past Surgical History  Procedure Laterality Date  . Tonsillectomy    . Cardiac catheterization  2003  . Cardiac catheterization  2011  . Cardiac catheterization  2012     Family History  Problem Relation Age of Onset  . Other Mother  Diabetes Mellitus  . Cancer Father   . Coronary artery disease Other   . Alzheimer's disease Sister   . Heart disease Brother   . Cancer Brother     brain tumor  . Hypertension Maternal Grandmother      History   Social History  . Marital Status: Married    Spouse Name: N/A    Number of Children: 3  . Years of Education: N/A   Occupational History  . Not on file.   Social History Main Topics  . Smoking status: Never Smoker   . Smokeless tobacco: Never Used  . Alcohol Use: Yes     Comment: Wine occcassionally  . Drug Use: No  . Sexual Activity: Not on file   Other Topics Concern  . Not on file   Social History Narrative   Has living will   Daughter Victorino Dike is health care POA   Has DNR order   No tube feeds if cognitively  unaware      PHYSICAL EXAM   BP 119/75  Pulse 79  Ht 5\' 6"  (1.676 m)  Wt 132 lb (59.875 kg)  BMI 21.32 kg/m2  Constitutional: She is not oriented to time. She appears well-developed and well-nourished. No distress.  HENT: No nasal discharge.  Head: Normocephalic and atraumatic.  Eyes: Pupils are equal and round. Right eye exhibits no discharge. Left eye exhibits no discharge.  Neck: Normal range of motion. Neck supple. No JVD present. No thyromegaly present.  Cardiovascular: Normal rate, regular rhythm, normal heart sounds. Exam reveals no gallop and no friction rub. No murmur heard.  Pulmonary/Chest: Effort normal and breath sounds normal. No stridor. No respiratory distress. She has no wheezes. She has no rales. She exhibits no tenderness.  Abdominal: Soft. Bowel sounds are normal. She exhibits no distension. There is no tenderness. There is no rebound and no guarding.  Musculoskeletal: Normal range of motion. She exhibits no edema and no tenderness.  Neurological: She is alert and oriented to person, place, and time. Coordination normal.  Skin: Skin is warm and dry. No rash noted. She is not diaphoretic. No erythema. No pallor.  Psychiatric: Poor insight.  ZOX:WRUEA rhythm with PVCs. Left bundle branch block   ASSESSMENT AND PLAN

## 2013-06-18 LAB — BASIC METABOLIC PANEL
BUN: 17 mg/dL (ref 8–27)
CO2: 23 mmol/L (ref 18–29)
Calcium: 9.5 mg/dL (ref 8.6–10.2)
Chloride: 100 mmol/L (ref 97–108)
GFR calc Af Amer: 55 mL/min/{1.73_m2} — ABNORMAL LOW (ref >59)
Glucose: 99 mg/dL (ref 65–99)
Potassium: 4.2 mmol/L (ref 3.5–5.2)

## 2013-06-21 ENCOUNTER — Telehealth: Payer: Self-pay

## 2013-06-21 NOTE — Telephone Encounter (Signed)
Message copied by Marilynne Halsted on Tue Jun 21, 2013  8:40 AM ------      Message from: Lorine Bears A      Created: Sun Jun 19, 2013 12:19 PM       Improved kidney function and sodium. Continue same medications. ------

## 2013-06-21 NOTE — Telephone Encounter (Signed)
Spoke w/ pt.  She is aware of results and is agreeable with the plan.

## 2013-06-23 ENCOUNTER — Telehealth: Payer: Self-pay

## 2013-06-23 MED ORDER — GLUCOSE BLOOD VI STRP: ORAL_STRIP | Status: AC

## 2013-06-23 NOTE — Telephone Encounter (Signed)
Mr Maultsby request refill one touch test strips to [facility]. Advised done.

## 2013-06-23 NOTE — Telephone Encounter (Signed)
Mr Thoreson said [facility] has not received test strips. I spoke with Alcario Drought at [facility] and strips are ready for pick up. Mr. Ord advised by Revonda Standard.

## 2013-06-30 ENCOUNTER — Ambulatory Visit: Payer: Self-pay | Admitting: Family Medicine

## 2013-07-01 ENCOUNTER — Encounter: Payer: Self-pay | Admitting: Physician Assistant

## 2013-07-01 ENCOUNTER — Ambulatory Visit (INDEPENDENT_AMBULATORY_CARE_PROVIDER_SITE_OTHER): Payer: Medicare Other | Admitting: Physician Assistant

## 2013-07-01 VITALS — BP 108/64 | HR 89 | Ht 66.0 in | Wt 134.5 lb

## 2013-07-01 DIAGNOSIS — R079 Chest pain, unspecified: Secondary | ICD-10-CM

## 2013-07-01 DIAGNOSIS — F411 Generalized anxiety disorder: Secondary | ICD-10-CM

## 2013-07-01 DIAGNOSIS — F419 Anxiety disorder, unspecified: Secondary | ICD-10-CM

## 2013-07-01 DIAGNOSIS — I2510 Atherosclerotic heart disease of native coronary artery without angina pectoris: Secondary | ICD-10-CM

## 2013-07-01 DIAGNOSIS — I5022 Chronic systolic (congestive) heart failure: Secondary | ICD-10-CM

## 2013-07-01 MED ORDER — METOPROLOL SUCCINATE ER 25 MG PO TB24: 25.0000 mg | ORAL_TABLET | Freq: Every day | ORAL | Status: DC

## 2013-07-01 MED ORDER — ISOSORBIDE MONONITRATE 15 MG HALF TABLET: 15.0000 mg | ORAL_TABLET | Freq: Every day | ORAL | Status: DC

## 2013-07-01 NOTE — Progress Notes (Addendum)
Patient ID: [name] Short, female   DOB: 18-Jan-1941, 72 y.o.   MRN: [id number]          Date:  07/01/2013   ID:  [name] Short, DOB [birth year], MRN [id number]  PCP:  Tillman Abide, MD  Primary Cardiologist:  Judie Petit. Kirke Corin, MD   History of Present Illness:  [name] Short is a 72 y.o. female w/ PMHx s/f advanced dementia, CAD (s/p PCI-LAD, diag & PLB previously), HFrEF, mod-severe MR, chronic LBBB, carotid artery disease->CVA (per history), CKD (stage III), DM2, HTN, HLD, asthma and anxiety who presents today as a work-in visit.   Last cardiac catheterization 09/2011 revealed patent stents. 2D echo 06/2011 indicated preserved LVEF per Dr. Jari Sportsman last office note earlier this month. She underwent a Lexiscan Myoview 12/2012 at [facility] revaeling apical thinning, decreased anteroseptal perfusion felt to represent breast attenuation, no evidence of ischemia, EF 39%; overall low risk.   She was admitted 04/2013 with hyponatremia and again in August for a small NSTEMI. After a long discussion with the family, due to the patient's advanced dementia, cath was deferred and medical management was pursued. Plavix was added. She re-presented shortly after with confusion and severe hyponatremia (Na 117) treated with Tolvaptan. HCTZ d/ced. She was volume overloaded. A repeat echo revealed EF 25-30%, severe LV dilatation, mild LA dilatation, mod-severe MR, mild TR. This improved with aquaphoresis and she was stable on follow-up 06/17/13 with Dr. Kirke Corin. A note was made to avoid invasive procedure d/t advanced dementia.  The patient's husband and caregiver accompany her today. The patient is unable to recollect recent chest pain. From her caregiver and husband's descriptions, she has experienced intermittent sharp, left-sided chest pain lasting for several seconds occurring fairly regularly in the evening around 8-9 PM prior to going to sleep. She has had at least one episode with radiation to her left arm. She takes NTG SL x 1  each time with immediate relief. The discomfort does occur shortly after meals. Most recently, this has occurred at random throughout the day and has awoken her from sleep at night. No exertional component, associated dyspnea, diaphoresis, nausea. No PND, orthopnea, LE edema, weight gain, palpitations or syncope. No aggravation with inspiration, position changes or laying flat. No fevers, chills or new cough. No trouble swallowing. The caregiver attributes her symptoms to anxiety. No clear relation to her prior MI/anginal pain. She has a strong support system who manage her medications and monitor her daily vitals and symptoms.   EKG: NSR, 89 bpm, LBBB (old), LAD  Wt Readings from Last 3 Encounters:  07/01/13 134 lb 8 oz (61.009 kg)  06/17/13 132 lb (59.875 kg)  06/02/13 139 lb (63.05 kg)     Past Medical History  Diagnosis Date  . Hypercholesteremia   . CAD (coronary artery disease)prior stents LAD. Cx Diagonal 2003   . Occlusion and stenosis of carotid artery with cerebral infarction     <15% R <50% L 2012  . Hypertension   . Mitral regurgitation     Mod-severe  . Dementia   . Type II or unspecified type diabetes mellitus with neurological manifestations, not stated as uncontrolled(250.60)   . Sleep disturbance, unspecified   . Anxiety   . Osteoarthritis, shoulder   . Chronic kidney disease   . MI (myocardial infarction)     x3  . Asthma     hx  . PVC (premature ventricular contraction)   . Chronic systolic heart failure 05/2013    EF 25-30% with moderate to severe  mitral regurgitation    Current Outpatient Prescriptions  Medication Sig Dispense Refill  . aspirin 81 MG tablet Take 81 mg by mouth daily.      Marland Kitchen atorvastatin (LIPITOR) 40 MG tablet Take 40 mg by mouth daily.      . clopidogrel (PLAVIX) 75 MG tablet Take 75 mg by mouth daily.       . divalproex (DEPAKOTE) 125 MG DR tablet Take 125 mg by mouth daily.       Marland Kitchen donepezil (ARICEPT) 5 MG tablet Take 5 mg by mouth at  bedtime.      . furosemide (LASIX) 20 MG tablet Take 20 mg by mouth as directed. 1/2 tablet q48 hours      . glucose blood (ONE TOUCH TEST STRIPS) test strip Check blood sugar twice a week and as directed. Dx 250.60  100 each  1  . lisinopril (PRINIVIL,ZESTRIL) 10 MG tablet Take 10 mg by mouth daily.       . memantine (NAMENDA) 10 MG tablet Take 10 mg by mouth 2 (two) times daily.      . metFORMIN (GLUCOPHAGE) 1000 MG tablet Take 500 mg by mouth 2 (two) times daily with a meal.       . nitroGLYCERIN (NITROSTAT) 0.4 MG SL tablet Place 1 tablet (0.4 mg total) under the tongue every 5 (five) minutes as needed for chest pain.  90 tablet  3  . spironolactone (ALDACTONE) 25 MG tablet Take 25 mg by mouth daily.       . traZODone (DESYREL) 50 MG tablet Take 50 mg by mouth at bedtime.       . isosorbide mononitrate (IMDUR) 15 mg TB24 24 hr tablet Take 0.5 tablets (15 mg total) by mouth daily.  30 tablet  3  . metoprolol succinate (TOPROL-XL) 25 MG 24 hr tablet Take 1 tablet (25 mg total) by mouth daily. Take with or immediately following a meal.  30 tablet  3   No current facility-administered medications for this visit.    Allergies:    Allergies  Allergen Reactions  . Ciprofloxacin Rash    Social History:  The patient  reports that she has never smoked. She has never used smokeless tobacco. She reports that she does not drink alcohol or use illicit drugs.   Family History:  Family History  Problem Relation Age of Onset  . Other Mother     Diabetes Mellitus  . Cancer Father   . Coronary artery disease Other   . Alzheimer's disease Sister   . Heart disease Brother   . Cancer Brother     brain tumor  . Hypertension Maternal Grandmother     Review of Systems: General: negative for chills, fever, night sweats or weight changes.  Cardiovascular: positive for chest pain, negative for dyspnea on exertion, edema, orthopnea, palpitations, paroxysmal nocturnal dyspnea or shortness of  breath Dermatological: negative for rash Respiratory: negative for cough or wheezing Urologic: negative for hematuria Abdominal: negative for nausea, vomiting, diarrhea, bright red blood per rectum, melena, or hematemesis Neurologic: negative for visual changes, syncope, or dizziness All other systems reviewed and are otherwise negative except as noted above.  PHYSICAL EXAM: VS:  BP 108/64  Pulse 89  Ht 5\' 6"  (1.676 m)  Wt 134 lb 8 oz (61.009 kg)  BMI 21.72 kg/m2 Well nourished, well developed, in no acute distress HEENT: normal, PERRL Neck: no JVD or bruits Cardiac:  normal S1, S2; RRR; no murmur or gallops Lungs:  clear to  auscultation bilaterally, no wheezing, rhonchi or rales Abd: soft, nontender, no hepatomegaly, normoactive BS x 4 quads Ext: no edema, cyanosis or clubbing Skin: warm and dry, cap refill < 2 sec Neuro:  CNs 2-12 intact, no focal abnormalities noted Musculoskeletal: no chest well tenderness to palpation, strength and tone appropriate for age  Psych: normal affect

## 2013-07-01 NOTE — Assessment & Plan Note (Signed)
Imdur added. Could consider Ranexa if anginal type pain develops. Atypical pain overall for the past 1-2 weeks. Continue ASA, Plavix, ACEi, BB, statin and NTG SL PRN.

## 2013-07-01 NOTE — Patient Instructions (Addendum)
Please take Imdur (long acting nitroglycerin) as prescribed in addition to sublingual nitroglycerin as needed.   Take this based on when you are having chest pain the most often (if mostly at night, take around dinner time). If affect BP during the day with associated lightheadedness or fatigue, take at night or call our office to adjust this medication.   We will try this for 1 week to see if it alleviates your discomfort.   If not, we can start an acid reducing medicine (such as Zantac or Prilosec) or even a different anti-angina medication such as Ranexa. Anxiety may be playing a role a Dr. Alphonsus Sias can help in managing that.   We will see you back in 3 months as previously scheduled.

## 2013-07-01 NOTE — Assessment & Plan Note (Signed)
Follow-up PCP

## 2013-07-01 NOTE — Assessment & Plan Note (Signed)
No CHF type symptoms. Euvolemic on exam. Maintained on low-dose Lasix regimen. Continue ACEi, BB, spironolactone. Will replace Lopressor with Toprol-XL for mortality benefit.

## 2013-07-01 NOTE — Assessment & Plan Note (Addendum)
The patient has been experiencing fleeting chest pain lasting for several seconds over the past 1-2 weeks. It occurs consistently at night time after eating a snack and occasionally wakes her from sleep. It has started to occur at random during the daytime. No clear relation to prior anginal pain. No exertional component. EKG indicates no significant change compared to 9/5 tracing. Differential etiologies include reflux/indigestion, anxiety or less likely angina. The discomfort does respond to NTG to which esophageal etiologies may respond. The fact that it occurs around the same time every evening after a snack and is non-exertional supports an esophageal/GI cause. Given her cardiac history, and NTG responsiveness, will plan to start low-dose Imdur. Discussed starting H2 blocker/PPI. They would like to try Imdur first, if discomfort persists, will pursue this option. Anxiety may be playing a role as well, however would not necessarily wake her from sleep.

## 2013-07-04 ENCOUNTER — Other Ambulatory Visit: Payer: Self-pay | Admitting: Internal Medicine

## 2013-07-04 NOTE — Telephone Encounter (Signed)
Victorino Dike [facility] request refill on all listed meds; previously filled by cardiologist in [facility]. Dr Alphonsus Sias has never filled before and Victorino Dike is not sure if pt's cardiologist here should fill any of meds.Victorino Dike request refills be done.Please advise.[facility].

## 2013-07-04 NOTE — Telephone Encounter (Signed)
Ok to fill 

## 2013-07-04 NOTE — Telephone Encounter (Signed)
Pt's husband called and says his wife wants a referral to a dermatologist for rash on both arms. Can you put in a referral for her or does she need to be seen first? Thank you.

## 2013-07-05 MED ORDER — TRAZODONE HCL 50 MG PO TABS: 50.0000 mg | ORAL_TABLET | Freq: Every day | ORAL | Status: DC

## 2013-07-05 MED ORDER — METFORMIN HCL 1000 MG PO TABS: 500.0000 mg | ORAL_TABLET | Freq: Two times a day (BID) | ORAL | Status: DC

## 2013-07-05 MED ORDER — LISINOPRIL 10 MG PO TABS: 10.0000 mg | ORAL_TABLET | Freq: Every day | ORAL | Status: DC

## 2013-07-05 MED ORDER — FUROSEMIDE 20 MG PO TABS: 20.0000 mg | ORAL_TABLET | ORAL | Status: DC

## 2013-07-05 MED ORDER — CLOPIDOGREL BISULFATE 75 MG PO TABS: 75.0000 mg | ORAL_TABLET | Freq: Every day | ORAL | Status: DC

## 2013-07-05 MED ORDER — SPIRONOLACTONE 25 MG PO TABS: 25.0000 mg | ORAL_TABLET | Freq: Every day | ORAL | Status: DC

## 2013-07-05 NOTE — Telephone Encounter (Signed)
rx sent to pharmacy by e-script  

## 2013-07-05 NOTE — Telephone Encounter (Signed)
All these can be done for a year She has established as my patient

## 2013-07-12 ENCOUNTER — Telehealth: Payer: Self-pay

## 2013-07-12 ENCOUNTER — Ambulatory Visit (INDEPENDENT_AMBULATORY_CARE_PROVIDER_SITE_OTHER): Payer: Medicare Other | Admitting: Internal Medicine

## 2013-07-12 ENCOUNTER — Encounter: Payer: Self-pay | Admitting: Internal Medicine

## 2013-07-12 VITALS — BP 100/60 | HR 77 | Temp 98.6°F | Wt 137.0 lb

## 2013-07-12 DIAGNOSIS — F0280 Dementia in other diseases classified elsewhere without behavioral disturbance: Secondary | ICD-10-CM

## 2013-07-12 DIAGNOSIS — I5022 Chronic systolic (congestive) heart failure: Secondary | ICD-10-CM

## 2013-07-12 DIAGNOSIS — F419 Anxiety disorder, unspecified: Secondary | ICD-10-CM

## 2013-07-12 DIAGNOSIS — F411 Generalized anxiety disorder: Secondary | ICD-10-CM

## 2013-07-12 DIAGNOSIS — I509 Heart failure, unspecified: Secondary | ICD-10-CM

## 2013-07-12 DIAGNOSIS — F068 Other specified mental disorders due to known physiological condition: Secondary | ICD-10-CM

## 2013-07-12 MED ORDER — CITALOPRAM HYDROBROMIDE 10 MG PO TABS: 10.0000 mg | ORAL_TABLET | Freq: Every day | ORAL | Status: DC

## 2013-07-12 MED ORDER — FUROSEMIDE 40 MG PO TABS: 40.0000 mg | ORAL_TABLET | Freq: Every day | ORAL | Status: DC

## 2013-07-12 NOTE — Telephone Encounter (Signed)
Spoke w/ pt and husband. Sched to come over for nurse visit on 10/6 @ 10:30 for BMET, wt, & CHF education. Will schedule f/u w/ Alinda Money, PA at that time, as husband didn't "have anything to write with and can't remember all those appointments".

## 2013-07-12 NOTE — Patient Instructions (Signed)
Please increase the furosemide to 40mg  daily (you can use 2 of the 20mg  tabs till they are gone).  Start the citalopram for anxiety. Call if there are any significant problems with this

## 2013-07-12 NOTE — Progress Notes (Signed)
Subjective:    Patient ID: [name] Short, female    DOB: [birth year], 72 y.o.   MRN: [id number]  HPI Here with husband Caregiver 12 hours per day -- 7 days per week Not sleeping well Feet are puffy--despite trying to keep them elevated  Some chest pain--mostly just at rest Isosorbide didn't seem to help--stopped it after a week  Notes SOB if sitting flat Has to sit up No clear cut PND though  Some SOB during day Not very active---she isn't clear about whether it is exertional  Does have some anxiety Vague about this Caregiver notes daily persistent anxiety  Current Outpatient Prescriptions on File Prior to Visit  Medication Sig Dispense Refill  . aspirin 81 MG tablet Take 81 mg by mouth daily.      Marland Kitchen atorvastatin (LIPITOR) 40 MG tablet Take 40 mg by mouth daily.      . clopidogrel (PLAVIX) 75 MG tablet Take 1 tablet (75 mg total) by mouth daily.  90 tablet  3  . divalproex (DEPAKOTE) 125 MG DR tablet Take 125 mg by mouth daily.       Marland Kitchen donepezil (ARICEPT) 5 MG tablet Take 5 mg by mouth at bedtime.      . furosemide (LASIX) 20 MG tablet Take 1 tablet (20 mg total) by mouth as directed.  90 tablet  3  . glucose blood (ONE TOUCH TEST STRIPS) test strip Check blood sugar twice a week and as directed. Dx 250.60  100 each  1  . lisinopril (PRINIVIL,ZESTRIL) 10 MG tablet Take 1 tablet (10 mg total) by mouth daily.  90 tablet  3  . memantine (NAMENDA) 10 MG tablet Take 10 mg by mouth 2 (two) times daily.      . metFORMIN (GLUCOPHAGE) 1000 MG tablet Take 0.5 tablets (500 mg total) by mouth 2 (two) times daily with a meal.  90 tablet  3  . metoprolol succinate (TOPROL-XL) 25 MG 24 hr tablet Take 1 tablet (25 mg total) by mouth daily. Take with or immediately following a meal.  30 tablet  3  . nitroGLYCERIN (NITROSTAT) 0.4 MG SL tablet Place 1 tablet (0.4 mg total) under the tongue every 5 (five) minutes as needed for chest pain.  90 tablet  3  . spironolactone (ALDACTONE) 25 MG tablet  Take 1 tablet (25 mg total) by mouth daily.  90 tablet  3  . traZODone (DESYREL) 50 MG tablet Take 1 tablet (50 mg total) by mouth at bedtime.  90 tablet  3   No current facility-administered medications on file prior to visit.    Allergies  Allergen Reactions  . Ciprofloxacin Rash    Past Medical History  Diagnosis Date  . Hypercholesteremia   . CAD (coronary artery disease)prior stents LAD. Cx Diagonal 2003   . Occlusion and stenosis of carotid artery with cerebral infarction     <15% R <50% L 2012  . Hypertension   . Mitral regurgitation     Mod-severe  . Dementia   . Type II or unspecified type diabetes mellitus with neurological manifestations, not stated as uncontrolled(250.60)   . Sleep disturbance, unspecified   . Anxiety   . Osteoarthritis, shoulder   . Chronic kidney disease   . MI (myocardial infarction)     x3  . Asthma     hx  . PVC (premature ventricular contraction)   . Chronic systolic heart failure 05/2013    EF 25-30% with moderate to severe mitral regurgitation  Past Surgical History  Procedure Laterality Date  . Tonsillectomy    . Cardiac catheterization  2003  . Cardiac catheterization  2011  . Cardiac catheterization  2012    Family History  Problem Relation Age of Onset  . Other Mother     Diabetes Mellitus  . Cancer Father   . Coronary artery disease Other   . Alzheimer's disease Sister   . Heart disease Brother   . Cancer Brother     brain tumor  . Hypertension Maternal Grandmother     History   Social History  . Marital Status: Married    Spouse Name: N/A    Number of Children: 3  . Years of Education: N/A   Occupational History  . Not on file.   Social History Main Topics  . Smoking status: Never Smoker   . Smokeless tobacco: Never Used  . Alcohol Use: No     Comment: Wine occcassionally  . Drug Use: No  . Sexual Activity: Not on file   Other Topics Concern  . Not on file   Social History Narrative   Has  living will   Daughter Victorino Dike is health care POA   Has DNR order   No tube feeds if cognitively unaware   Review of Systems Notes easy bruising--especially on arms Not great appetite but will eat    Objective:   Physical Exam  Constitutional: She appears well-developed and well-nourished. No distress.  Neck: Normal range of motion. Neck supple. No thyromegaly present.  Cardiovascular: Normal rate, regular rhythm and normal heart sounds.  Exam reveals no gallop.   No murmur heard. Pulmonary/Chest: No respiratory distress. She has no wheezes. She has no rales.  ?dullness and decreased breath sounds at L>R base  Musculoskeletal: She exhibits edema.  Trace edema  Lymphadenopathy:    She has no cervical adenopathy.  Psychiatric:  Mildly anxious Limited recall and insight          Assessment & Plan:

## 2013-07-12 NOTE — Assessment & Plan Note (Signed)
Mild but limits her ability to discuss her symptoms

## 2013-07-12 NOTE — Assessment & Plan Note (Signed)
Aide endorses daily and pervasive anxiety  Will start low dose citalopram

## 2013-07-12 NOTE — Assessment & Plan Note (Signed)
May be some worse  Weight up slightly ?slight effusions Has clear cut orthopnea Will increase the furosemide to 40mg  daily

## 2013-07-12 NOTE — Telephone Encounter (Signed)
Pt sched for 07/18/13 for nurse visit for bmet, wt, and chf edu.

## 2013-07-12 NOTE — Telephone Encounter (Signed)
Message copied by Marilynne Halsted on Tue Jul 12, 2013  1:40 PM ------      Message from: [name] Short      Created: Tue Jul 12, 2013 12:50 PM       Patient seen recently by PCP- Dr. Alphonsus Sias- today. +orthopnea, LE edema noted. Weight up. Lasix increased to 40mg  daily. May be due to switching Lopressor to Toprol-XL on following up with me recently. Can we check Short BMET, weight and provide CHF education (salt/fluid restriction, BP control, daily weight monitoring) on 10/6 (nurses visit?). I'd like to see her in 2 weeks. If she continues to have worsening CHF symptoms +/- chest pain, may need to readdress risk/benefit of cath. This was deferred recently d/t dementia.             Lesia Hausen ------

## 2013-07-18 ENCOUNTER — Ambulatory Visit (INDEPENDENT_AMBULATORY_CARE_PROVIDER_SITE_OTHER): Payer: Medicare Other

## 2013-07-18 ENCOUNTER — Other Ambulatory Visit: Payer: Self-pay | Admitting: Cardiovascular Disease

## 2013-07-18 DIAGNOSIS — I509 Heart failure, unspecified: Secondary | ICD-10-CM

## 2013-07-18 DIAGNOSIS — R0602 Shortness of breath: Secondary | ICD-10-CM

## 2013-07-18 DIAGNOSIS — Z79899 Other long term (current) drug therapy: Secondary | ICD-10-CM

## 2013-07-19 ENCOUNTER — Other Ambulatory Visit: Payer: Self-pay | Admitting: Physician Assistant

## 2013-07-27 ENCOUNTER — Ambulatory Visit (INDEPENDENT_AMBULATORY_CARE_PROVIDER_SITE_OTHER): Payer: Medicare Other | Admitting: Internal Medicine

## 2013-07-27 ENCOUNTER — Ambulatory Visit: Payer: Medicare Other | Admitting: Internal Medicine

## 2013-07-27 ENCOUNTER — Encounter: Payer: Self-pay | Admitting: Internal Medicine

## 2013-07-27 VITALS — BP 118/60 | HR 73 | Temp 97.8°F | Wt 136.0 lb

## 2013-07-27 DIAGNOSIS — I5022 Chronic systolic (congestive) heart failure: Secondary | ICD-10-CM

## 2013-07-27 DIAGNOSIS — F0280 Dementia in other diseases classified elsewhere without behavioral disturbance: Secondary | ICD-10-CM

## 2013-07-27 DIAGNOSIS — F068 Other specified mental disorders due to known physiological condition: Secondary | ICD-10-CM

## 2013-07-27 DIAGNOSIS — F411 Generalized anxiety disorder: Secondary | ICD-10-CM

## 2013-07-27 DIAGNOSIS — F419 Anxiety disorder, unspecified: Secondary | ICD-10-CM

## 2013-07-27 MED ORDER — CITALOPRAM HYDROBROMIDE 20 MG PO TABS: 20.0000 mg | ORAL_TABLET | Freq: Every day | ORAL | Status: AC

## 2013-07-27 NOTE — Assessment & Plan Note (Signed)
Seems like she may be close to baseline weight--though can't judge Does weight daily

## 2013-07-27 NOTE — Assessment & Plan Note (Signed)
Stable mild dementia Doing okay with aides 7 days per week (around the clock actually)

## 2013-07-27 NOTE — Assessment & Plan Note (Signed)
Persists though better on low dose citalopram May be the cause of the dyspnea in the evening Will increase to 20mg 

## 2013-07-27 NOTE — Progress Notes (Signed)
Subjective:    Patient ID: [name] Short, female    DOB: 04-29-1941, 72 y.o.   MRN: [id number]  HPI Here with aide  Sleeping better since on the citalopram Anxiety still is bad around bedtime-- may be up from 10PM to 1-2AM still Clearly better than before though No daytime problems-- other than fatigue if she sleeps poorly No problems with the medication---no jitteriness  No breathing problems Still gets some edema Uses 3 pillows and will rarely have PND---has to sit up This seems to be some better per the sitter gets chest pain at night---asks for nitro but does okay without it  Current Outpatient Prescriptions on File Prior to Visit  Medication Sig Dispense Refill  . aspirin 81 MG tablet Take 81 mg by mouth daily.      Marland Kitchen atorvastatin (LIPITOR) 40 MG tablet Take 40 mg by mouth daily.      . citalopram (CELEXA) 10 MG tablet Take 1 tablet (10 mg total) by mouth daily.  30 tablet  11  . clopidogrel (PLAVIX) 75 MG tablet Take 1 tablet (75 mg total) by mouth daily.  90 tablet  3  . divalproex (DEPAKOTE) 125 MG DR tablet Take 125 mg by mouth daily.       Marland Kitchen donepezil (ARICEPT) 5 MG tablet Take 5 mg by mouth at bedtime.      . furosemide (LASIX) 40 MG tablet Take 1 tablet (40 mg total) by mouth daily.  30 tablet  3  . glucose blood (ONE TOUCH TEST STRIPS) test strip Check blood sugar twice a week and as directed. Dx 250.60  100 each  1  . lisinopril (PRINIVIL,ZESTRIL) 10 MG tablet Take 1 tablet (10 mg total) by mouth daily.  90 tablet  3  . memantine (NAMENDA) 10 MG tablet Take 10 mg by mouth 2 (two) times daily.      . metFORMIN (GLUCOPHAGE) 1000 MG tablet Take 0.5 tablets (500 mg total) by mouth 2 (two) times daily with a meal.  90 tablet  3  . nitroGLYCERIN (NITROSTAT) 0.4 MG SL tablet Place 1 tablet (0.4 mg total) under the tongue every 5 (five) minutes as needed for chest pain.  90 tablet  3  . spironolactone (ALDACTONE) 25 MG tablet Take 1 tablet (25 mg total) by mouth daily.  90  tablet  3  . traZODone (DESYREL) 50 MG tablet Take 1 tablet (50 mg total) by mouth at bedtime.  90 tablet  3   No current facility-administered medications on file prior to visit.    Allergies  Allergen Reactions  . Ciprofloxacin Rash    Past Medical History  Diagnosis Date  . Hypercholesteremia   . CAD (coronary artery disease)prior stents LAD. Cx Diagonal 2003   . Occlusion and stenosis of carotid artery with cerebral infarction     <15% R <50% L 2012  . Hypertension   . Mitral regurgitation     Mod-severe  . Dementia   . Type II or unspecified type diabetes mellitus with neurological manifestations, not stated as uncontrolled(250.60)   . Sleep disturbance, unspecified   . Anxiety   . Osteoarthritis, shoulder   . Chronic kidney disease   . MI (myocardial infarction)     x3  . Asthma     hx  . PVC (premature ventricular contraction)   . Chronic systolic heart failure 05/2013    EF 25-30% with moderate to severe mitral regurgitation    Past Surgical History  Procedure Laterality Date  .  Tonsillectomy    . Cardiac catheterization  2003  . Cardiac catheterization  2011  . Cardiac catheterization  2012    Family History  Problem Relation Age of Onset  . Other Mother     Diabetes Mellitus  . Cancer Father   . Coronary artery disease Other   . Alzheimer's disease Sister   . Heart disease Brother   . Cancer Brother     brain tumor  . Hypertension Maternal Grandmother     History   Social History  . Marital Status: Married    Spouse Name: N/A    Number of Children: 3  . Years of Education: N/A   Occupational History  . Not on file.   Social History Main Topics  . Smoking status: Never Smoker   . Smokeless tobacco: Never Used  . Alcohol Use: No     Comment: Wine occcassionally  . Drug Use: No  . Sexual Activity: Not on file   Other Topics Concern  . Not on file   Social History Narrative   Has living will   Daughter Victorino Dike is health care POA    Has DNR order   No tube feeds if cognitively unaware   Review of Systems Appetite is okay---depends on what it is    Objective:   Physical Exam  Constitutional: She appears well-developed and well-nourished. No distress.  Cardiovascular: Normal rate, regular rhythm and normal heart sounds.  Exam reveals no gallop.   No murmur heard. Pulmonary/Chest: Breath sounds normal. No respiratory distress. She has no wheezes. She has no rales.  Musculoskeletal: She exhibits no edema.  Psychiatric: She has a normal mood and affect. Her behavior is normal.          Assessment & Plan:

## 2013-07-27 NOTE — Patient Instructions (Signed)
Please increase the citalopram to 20mg  daily (you can use 2 of the 10mg  tabs till they are gone)

## 2013-08-03 ENCOUNTER — Telehealth: Payer: Self-pay

## 2013-08-03 NOTE — Telephone Encounter (Signed)
Lashelle care giver said pt usually weighs 132-133 lbs. Since 08/02/13 pt lost 5 lbs and today weighs 125 lbs. Lashelle said pt is eating and does not have diarrhea or urinating more than usual.Please advise.

## 2013-08-03 NOTE — Telephone Encounter (Signed)
If she is eating well, this may just be fluid weight. Just continue to monitor and let me know if it continues to go down

## 2013-08-03 NOTE — Telephone Encounter (Signed)
Spoke with patient's husband and advised results, he will call if any more changes

## 2013-08-08 ENCOUNTER — Telehealth: Payer: Self-pay | Admitting: Nurse Practitioner

## 2013-08-08 DIAGNOSIS — I5022 Chronic systolic (congestive) heart failure: Secondary | ICD-10-CM

## 2013-08-08 NOTE — Telephone Encounter (Signed)
Message copied by Levi Aland on Mon Aug 08, 2013  3:22 PM ------      Message from: Odella Aquas A      Created: Fri Aug 05, 2013  9:27 PM       Please schedule a comprehensive metabolic panel for 10/27 and follow-up with Dr. Kirke Corin next week or myself the week after. Lost an unintentional 6 lbs recently. Patient's Cr was up a bit at 1.5 on 10/6 (was 1.4 in 8/14, then 1.14 in 9/14). Electrolytes WNL, but history of hyponatremia. ------

## 2013-08-08 NOTE — Telephone Encounter (Signed)
Reviewed lab results with patient and caregiver whom patient put on the phone.  I advised of need for complete metabolic panel tomorrow 10/28 and for appointment in [facility] Friday 10/31 @ 10:45.  Order for lab in [facility] and appointments scheduled by French Ana in [place] office.

## 2013-08-09 ENCOUNTER — Ambulatory Visit (INDEPENDENT_AMBULATORY_CARE_PROVIDER_SITE_OTHER): Payer: Medicare Other | Admitting: *Deleted

## 2013-08-09 ENCOUNTER — Other Ambulatory Visit: Payer: Self-pay | Admitting: Cardiovascular Disease

## 2013-08-09 DIAGNOSIS — I5022 Chronic systolic (congestive) heart failure: Secondary | ICD-10-CM

## 2013-08-10 LAB — CMP14+EGFR
AST: 11 IU/L (ref 0–40)
Albumin/Globulin Ratio: 1.8 (ref 1.1–2.5)
Albumin: 4.2 g/dL (ref 3.5–4.8)
Alkaline Phosphatase: 70 IU/L (ref 39–117)
BUN/Creatinine Ratio: 23 (ref 11–26)
BUN: 34 mg/dL — ABNORMAL HIGH (ref 8–27)
CO2: 20 mmol/L (ref 18–29)
Creatinine, Ser: 1.45 mg/dL — ABNORMAL HIGH (ref 0.57–1.00)
GFR calc Af Amer: 42 mL/min/{1.73_m2} — ABNORMAL LOW (ref >59)
GFR calc non Af Amer: 36 mL/min/{1.73_m2} — ABNORMAL LOW (ref >59)
Globulin, Total: 2.3 g/dL (ref 1.5–4.5)
Glucose: 79 mg/dL (ref 65–99)
Sodium: 142 mmol/L (ref 134–144)
Total Bilirubin: 0.4 mg/dL (ref 0.0–1.2)

## 2013-08-11 ENCOUNTER — Telehealth: Payer: Self-pay

## 2013-08-11 NOTE — Telephone Encounter (Signed)
Spoke w/ pt's husband.  Gave him instructions on decreasing lasix to 20 mg, but he states that he would like something in writing and will wait until pt's next appt to make change. Informed him of the importance of decreasing the lasix for her kidneys, but he states that I need to speak w/ his daughter, as she handles meds. Left message on Jennifer's voicemail to call back.

## 2013-08-11 NOTE — Telephone Encounter (Signed)
Message copied by Marilynne Halsted on Thu Aug 11, 2013  9:19 AM ------      Message from: Lorine Bears A      Created: Thu Aug 11, 2013  7:58 AM       Slightly worse kidney function. Decrease Lasix to 20 mg daily. ------

## 2013-08-11 NOTE — Telephone Encounter (Signed)
Spoke w/ pt's daughter.  She is agreeable to plan and will call with any questions or concerns.

## 2013-08-12 ENCOUNTER — Encounter: Payer: Self-pay | Admitting: Cardiovascular Disease

## 2013-08-12 ENCOUNTER — Ambulatory Visit (INDEPENDENT_AMBULATORY_CARE_PROVIDER_SITE_OTHER): Payer: Medicare Other | Admitting: Cardiovascular Disease

## 2013-08-12 VITALS — BP 123/69 | HR 65 | Ht 67.0 in | Wt 127.5 lb

## 2013-08-12 DIAGNOSIS — I5022 Chronic systolic (congestive) heart failure: Secondary | ICD-10-CM

## 2013-08-12 DIAGNOSIS — I2510 Atherosclerotic heart disease of native coronary artery without angina pectoris: Secondary | ICD-10-CM

## 2013-08-12 MED ORDER — FUROSEMIDE 20 MG PO TABS: 20.0000 mg | ORAL_TABLET | Freq: Every day | ORAL | Status: DC

## 2013-08-12 NOTE — Patient Instructions (Signed)
Continue same medications.  Follow up in 3 months.  

## 2013-08-12 NOTE — Progress Notes (Signed)
HPI  This is a 72 year old female who is here today for a followup visit regarding CAD and chronic systolic heart failure. She has known history of coronary artery disease with prior stenting of her diagonal LAD and posterolateral branch. Cardiac catheterization in 12/12 showed patent stents .  Echocardiogram 9/12 demonstrated normal left ventricular function . Myoview 4/14  showed evidence of prior anterior MI with ejection fraction of 39%. The patient suffers from severe dementia. She was hospitalized in July for hyponatremia . She had another hospitalization in August for a small non-ST elevation myocardial infarction. She was treated medically. She presented one week after that with confusion and severe hyponatremia with a sodium of 117. She was also noted to be fluid overloaded. Echocardiogram showed an ejection fraction of 25-30% with moderate to severe mitral regurgitation. She was treated with Tolvaptan . Hydrochlorothiazide was discontinued.  She had recent fluid overload which required increasing the dose of Lasix to 40 mg daily. Labs from yesterday showed slight worsening of renal function. Thus, the dose was decreased back to 20 mg daily. She is feeling well.    Allergies  Allergen Reactions  . Ciprofloxacin Rash     Current Outpatient Prescriptions on File Prior to Visit  Medication Sig Dispense Refill  . aspirin 81 MG tablet Take 81 mg by mouth daily.      Marland Kitchen atorvastatin (LIPITOR) 40 MG tablet Take 40 mg by mouth daily.      . citalopram (CELEXA) 20 MG tablet Take 1 tablet (20 mg total) by mouth daily.  30 tablet  11  . clopidogrel (PLAVIX) 75 MG tablet Take 1 tablet (75 mg total) by mouth daily.  90 tablet  3  . divalproex (DEPAKOTE) 125 MG DR tablet Take 125 mg by mouth daily.       Marland Kitchen donepezil (ARICEPT) 5 MG tablet Take 5 mg by mouth at bedtime.      Marland Kitchen glucose blood (ONE TOUCH TEST STRIPS) test strip Check blood sugar twice a week and as directed. Dx 250.60  100 each  1  .  lisinopril (PRINIVIL,ZESTRIL) 10 MG tablet Take 1 tablet (10 mg total) by mouth daily.  90 tablet  3  . memantine (NAMENDA) 10 MG tablet Take 10 mg by mouth 2 (two) times daily.      . metFORMIN (GLUCOPHAGE) 1000 MG tablet Take 0.5 tablets (500 mg total) by mouth 2 (two) times daily with a meal.  90 tablet  3  . metoprolol (LOPRESSOR) 100 MG tablet Take 100 mg by mouth 2 (two) times daily.       . nitroGLYCERIN (NITROSTAT) 0.4 MG SL tablet Place 1 tablet (0.4 mg total) under the tongue every 5 (five) minutes as needed for chest pain.  90 tablet  3  . spironolactone (ALDACTONE) 25 MG tablet Take 1 tablet (25 mg total) by mouth daily.  90 tablet  3  . traZODone (DESYREL) 50 MG tablet Take 1 tablet (50 mg total) by mouth at bedtime.  90 tablet  3   No current facility-administered medications on file prior to visit.     Past Medical History  Diagnosis Date  . Hypercholesteremia   . CAD (coronary artery disease)prior stents LAD. Cx Diagonal 2003   . Occlusion and stenosis of carotid artery with cerebral infarction     <15% R <50% L 2012  . Hypertension   . Mitral regurgitation     Mod-severe  . Dementia   . Type II or unspecified  type diabetes mellitus with neurological manifestations, not stated as uncontrolled(250.60)   . Sleep disturbance, unspecified   . Anxiety   . Osteoarthritis, shoulder   . Chronic kidney disease   . MI (myocardial infarction)     x3  . Asthma     hx  . PVC (premature ventricular contraction)   . Chronic systolic heart failure 05/2013    EF 25-30% with moderate to severe mitral regurgitation     Past Surgical History  Procedure Laterality Date  . Tonsillectomy    . Cardiac catheterization  2003  . Cardiac catheterization  2011  . Cardiac catheterization  2012     Family History  Problem Relation Age of Onset  . Other Mother     Diabetes Mellitus  . Cancer Father   . Coronary artery disease Other   . Alzheimer's disease Sister   . Heart disease  Brother   . Cancer Brother     brain tumor  . Hypertension Maternal Grandmother      History   Social History  . Marital Status: Married    Spouse Name: N/A    Number of Children: 3  . Years of Education: N/A   Occupational History  . Not on file.   Social History Main Topics  . Smoking status: Never Smoker   . Smokeless tobacco: Never Used  . Alcohol Use: No     Comment: Wine occcassionally  . Drug Use: No  . Sexual Activity: Not on file   Other Topics Concern  . Not on file   Social History Narrative   Has living will   Daughter Victorino Dike is health care POA   Has DNR order   No tube feeds if cognitively unaware      PHYSICAL EXAM   BP 123/69  Pulse 65  Ht 5\' 7"  (1.702 m)  Wt 127 lb 8 oz (57.834 kg)  BMI 19.96 kg/m2  Constitutional: She is not oriented to time. She appears well-developed and well-nourished. No distress.  HENT: No nasal discharge.  Head: Normocephalic and atraumatic.  Eyes: Pupils are equal and round. Right eye exhibits no discharge. Left eye exhibits no discharge.  Neck: Normal range of motion. Neck supple. No JVD present. No thyromegaly present.  Cardiovascular: Normal rate, regular rhythm, normal heart sounds. Exam reveals no gallop and no friction rub. No murmur heard.  Pulmonary/Chest: Effort normal and breath sounds normal. No stridor. No respiratory distress. She has no wheezes. She has no rales. She exhibits no tenderness.  Abdominal: Soft. Bowel sounds are normal. She exhibits no distension. There is no tenderness. There is no rebound and no guarding.  Musculoskeletal: Normal range of motion. She exhibits no edema and no tenderness.  Neurological: She is alert and oriented to person, place, and time. Coordination normal.  Skin: Skin is warm and dry. No rash noted. She is not diaphoretic. No erythema. No pallor.  Psychiatric: Poor insight.     ASSESSMENT AND PLAN

## 2013-08-15 NOTE — Assessment & Plan Note (Signed)
She appears to be euvolemic. I recently decreased Lasix back to 20 mg daily.

## 2013-08-15 NOTE — Assessment & Plan Note (Signed)
She denies chest pain. Continue medical therapy.

## 2013-08-23 LAB — BASIC METABOLIC PANEL
BUN/Creatinine Ratio: 15 (ref 11–26)
BUN: 23 mg/dL (ref 8–27)
Chloride: 98 mmol/L (ref 97–108)
Creatinine, Ser: 1.53 mg/dL — ABNORMAL HIGH (ref 0.57–1.00)
GFR calc Af Amer: 39 mL/min/{1.73_m2} — ABNORMAL LOW (ref >59)
Glucose: 144 mg/dL — ABNORMAL HIGH (ref 65–99)
Sodium: 139 mmol/L (ref 134–144)

## 2013-09-02 ENCOUNTER — Encounter: Payer: Self-pay | Admitting: Internal Medicine

## 2013-09-02 ENCOUNTER — Ambulatory Visit (INDEPENDENT_AMBULATORY_CARE_PROVIDER_SITE_OTHER): Payer: Medicare Other | Admitting: Internal Medicine

## 2013-09-02 VITALS — BP 100/60 | HR 60 | Temp 98.6°F | Wt 124.0 lb

## 2013-09-02 DIAGNOSIS — R634 Abnormal weight loss: Secondary | ICD-10-CM

## 2013-09-02 MED ORDER — MIRTAZAPINE 15 MG PO TABS: 15.0000 mg | ORAL_TABLET | Freq: Every day | ORAL | Status: AC

## 2013-09-02 NOTE — Patient Instructions (Signed)
Please stop divalproex and trazodone. Take the new medication, mirtazapine, close to bedtime. Call if you have any problems on this.  Please try snacks in between meals, like peanut butter or cheese crackers, or ensure/boost

## 2013-09-02 NOTE — Progress Notes (Signed)
Pre-visit discussion using our clinic review tool. No additional management support is needed unless otherwise documented below in the visit note.  

## 2013-09-02 NOTE — Assessment & Plan Note (Signed)
Hasn't been eating well since hospital stay Will stop the depakote and trazodone Try mirtazapine  Consider trial off statin or donepezil (though neither is new)

## 2013-09-02 NOTE — Progress Notes (Signed)
Subjective:    Patient ID: [name] Short, female    DOB: [birth year], 72 y.o.   MRN: [id number]  HPI Here with husband  Has not been eating Won't eat multiple different foods Has lost about 10# recently She once had a severe hypoglycemic reaction and he is concerned about this  Has 24 hour care still No recent medication changes  No abdominal pain Bowels have been okay  Current Outpatient Prescriptions on File Prior to Visit  Medication Sig Dispense Refill  . aspirin 81 MG tablet Take 81 mg by mouth daily.      Marland Kitchen atorvastatin (LIPITOR) 40 MG tablet Take 40 mg by mouth daily.      . citalopram (CELEXA) 20 MG tablet Take 1 tablet (20 mg total) by mouth daily.  30 tablet  11  . clopidogrel (PLAVIX) 75 MG tablet Take 1 tablet (75 mg total) by mouth daily.  90 tablet  3  . divalproex (DEPAKOTE) 125 MG DR tablet Take 125 mg by mouth daily.       Marland Kitchen donepezil (ARICEPT) 5 MG tablet Take 5 mg by mouth at bedtime.      . furosemide (LASIX) 20 MG tablet Take 1 tablet (20 mg total) by mouth daily.  30 tablet  6  . glucose blood (ONE TOUCH TEST STRIPS) test strip Check blood sugar twice a week and as directed. Dx 250.60  100 each  1  . lisinopril (PRINIVIL,ZESTRIL) 10 MG tablet Take 1 tablet (10 mg total) by mouth daily.  90 tablet  3  . memantine (NAMENDA) 10 MG tablet Take 10 mg by mouth 2 (two) times daily.      . metFORMIN (GLUCOPHAGE) 1000 MG tablet Take 0.5 tablets (500 mg total) by mouth 2 (two) times daily with a meal.  90 tablet  3  . nitroGLYCERIN (NITROSTAT) 0.4 MG SL tablet Place 1 tablet (0.4 mg total) under the tongue every 5 (five) minutes as needed for chest pain.  90 tablet  3  . spironolactone (ALDACTONE) 25 MG tablet Take 1 tablet (25 mg total) by mouth daily.  90 tablet  3  . traZODone (DESYREL) 50 MG tablet Take 1 tablet (50 mg total) by mouth at bedtime.  90 tablet  3   No current facility-administered medications on file prior to visit.    Allergies  Allergen Reactions   . Ciprofloxacin Rash    Past Medical History  Diagnosis Date  . Hypercholesteremia   . CAD (coronary artery disease)prior stents LAD. Cx Diagonal 2003   . Occlusion and stenosis of carotid artery with cerebral infarction     <15% R <50% L 2012  . Hypertension   . Mitral regurgitation     Mod-severe  . Dementia   . Type II or unspecified type diabetes mellitus with neurological manifestations, not stated as uncontrolled(250.60)   . Sleep disturbance, unspecified   . Anxiety   . Osteoarthritis, shoulder   . Chronic kidney disease   . MI (myocardial infarction)     x3  . Asthma     hx  . PVC (premature ventricular contraction)   . Chronic systolic heart failure 05/2013    EF 25-30% with moderate to severe mitral regurgitation    Past Surgical History  Procedure Laterality Date  . Tonsillectomy    . Cardiac catheterization  2003  . Cardiac catheterization  2011  . Cardiac catheterization  2012    Family History  Problem Relation Age of Onset  . Other Mother  Diabetes Mellitus  . Cancer Father   . Coronary artery disease Other   . Alzheimer's disease Sister   . Heart disease Brother   . Cancer Brother     brain tumor  . Hypertension Maternal Grandmother     History   Social History  . Marital Status: Married    Spouse Name: N/A    Number of Children: 3  . Years of Education: N/A   Occupational History  . Not on file.   Social History Main Topics  . Smoking status: Never Smoker   . Smokeless tobacco: Never Used  . Alcohol Use: No     Comment: Wine occcassionally  . Drug Use: No  . Sexual Activity: Not on file   Other Topics Concern  . Not on file   Social History Narrative   Has living will   Daughter Victorino Dike is health care POA   Has DNR order   No tube feeds if cognitively unaware     Review of Systems Breathing is okay Anxiety has been reasonably controlled    Objective:   Physical Exam  Constitutional: She appears well-developed.  No distress.  Abdominal: Soft. There is no tenderness.  Neurological:  Engages fairly well          Assessment & Plan:

## 2013-09-16 ENCOUNTER — Ambulatory Visit (INDEPENDENT_AMBULATORY_CARE_PROVIDER_SITE_OTHER): Payer: Medicare Other | Admitting: Cardiovascular Disease

## 2013-09-16 ENCOUNTER — Encounter: Payer: Self-pay | Admitting: Cardiovascular Disease

## 2013-09-16 VITALS — BP 117/63 | HR 68 | Ht 67.0 in | Wt 131.2 lb

## 2013-09-16 DIAGNOSIS — E785 Hyperlipidemia, unspecified: Secondary | ICD-10-CM

## 2013-09-16 DIAGNOSIS — I5022 Chronic systolic (congestive) heart failure: Secondary | ICD-10-CM

## 2013-09-16 DIAGNOSIS — I2510 Atherosclerotic heart disease of native coronary artery without angina pectoris: Secondary | ICD-10-CM

## 2013-09-16 MED ORDER — FUROSEMIDE 20 MG PO TABS: 20.0000 mg | ORAL_TABLET | Freq: Every day | ORAL | Status: DC

## 2013-09-16 NOTE — Progress Notes (Signed)
HPI  This is a 72 year old female who is here today for a followup visit regarding CAD and chronic systolic heart failure. She has known history of coronary artery disease with prior stenting of her diagonal LAD and posterolateral branch. Cardiac catheterization in 12/12 showed patent stents .  Echocardiogram 9/12 demonstrated normal left ventricular function . Myoview 4/14  showed evidence of prior anterior MI with ejection fraction of 39%. The patient suffers from severe dementia. She was hospitalized in July for hyponatremia . She had another hospitalization in August for a small non-ST elevation myocardial infarction. She was treated medically as per her choice and family decision given her advanced dementia. She presented one week after that with confusion and severe hyponatremia with a sodium of 117. She was also noted to be fluid overloaded. Echocardiogram showed an ejection fraction of 25-30% with moderate to severe mitral regurgitation. She was treated with Tolvaptan . Hydrochlorothiazide was discontinued.  She had recent fluid overload which required increasing the dose of Lasix to 40 mg daily. Labs showed slight worsening of renal function. Thus, the dose was decreased  to 20 mg daily.  She has been doing very well and denies any chest pain or dyspnea. She is taking all her medications regularly. She has very good support from family members.    Allergies  Allergen Reactions  . Ciprofloxacin Rash     Current Outpatient Prescriptions on File Prior to Visit  Medication Sig Dispense Refill  . aspirin 81 MG tablet Take 81 mg by mouth daily.      Marland Kitchen atorvastatin (LIPITOR) 40 MG tablet Take 40 mg by mouth daily.      . citalopram (CELEXA) 20 MG tablet Take 1 tablet (20 mg total) by mouth daily.  30 tablet  11  . clopidogrel (PLAVIX) 75 MG tablet Take 1 tablet (75 mg total) by mouth daily.  90 tablet  3  . donepezil (ARICEPT) 5 MG tablet Take 5 mg by mouth at bedtime.      . furosemide  (LASIX) 20 MG tablet Take 1 tablet (20 mg total) by mouth daily.  30 tablet  6  . glucose blood (ONE TOUCH TEST STRIPS) test strip Check blood sugar twice a week and as directed. Dx 250.60  100 each  1  . lisinopril (PRINIVIL,ZESTRIL) 10 MG tablet Take 1 tablet (10 mg total) by mouth daily.  90 tablet  3  . memantine (NAMENDA) 10 MG tablet Take 10 mg by mouth 2 (two) times daily.      . metFORMIN (GLUCOPHAGE) 1000 MG tablet Take 0.5 tablets (500 mg total) by mouth 2 (two) times daily with a meal.  90 tablet  3  . metoprolol succinate (TOPROL-XL) 25 MG 24 hr tablet Take 25 mg by mouth daily.       . mirtazapine (REMERON) 15 MG tablet Take 1 tablet (15 mg total) by mouth at bedtime.  30 tablet  5  . nitroGLYCERIN (NITROSTAT) 0.4 MG SL tablet Place 1 tablet (0.4 mg total) under the tongue every 5 (five) minutes as needed for chest pain.  90 tablet  3  . spironolactone (ALDACTONE) 25 MG tablet Take 1 tablet (25 mg total) by mouth daily.  90 tablet  3   No current facility-administered medications on file prior to visit.     Past Medical History  Diagnosis Date  . Hypercholesteremia   . CAD (coronary artery disease)prior stents LAD. Cx Diagonal 2003   . Occlusion and stenosis of carotid  artery with cerebral infarction     <15% R <50% L 2012  . Hypertension   . Mitral regurgitation     Mod-severe  . Dementia   . Type II or unspecified type diabetes mellitus with neurological manifestations, not stated as uncontrolled(250.60)   . Sleep disturbance, unspecified   . Anxiety   . Osteoarthritis, shoulder   . Chronic kidney disease   . MI (myocardial infarction)     x3  . Asthma     hx  . PVC (premature ventricular contraction)   . Chronic systolic heart failure 05/2013    EF 25-30% with moderate to severe mitral regurgitation     Past Surgical History  Procedure Laterality Date  . Tonsillectomy    . Cardiac catheterization  2003  . Cardiac catheterization  2011  . Cardiac  catheterization  2012     Family History  Problem Relation Age of Onset  . Other Mother     Diabetes Mellitus  . Cancer Father   . Coronary artery disease Other   . Alzheimer's disease Sister   . Heart disease Brother   . Cancer Brother     brain tumor  . Hypertension Maternal Grandmother      History   Social History  . Marital Status: Married    Spouse Name: N/A    Number of Children: 3  . Years of Education: N/A   Occupational History  . Not on file.   Social History Main Topics  . Smoking status: Never Smoker   . Smokeless tobacco: Never Used  . Alcohol Use: No     Comment: Wine occcassionally  . Drug Use: No  . Sexual Activity: Not on file   Other Topics Concern  . Not on file   Social History Narrative   Has living will   Daughter Victorino Dike is health care POA   Has DNR order   No tube feeds if cognitively unaware      PHYSICAL EXAM   BP 117/63  Pulse 68  Ht 5\' 7"  (1.702 m)  Wt 131 lb 4 oz (59.535 kg)  BMI 20.55 kg/m2  Constitutional: She is not oriented to time. She appears well-developed and well-nourished. No distress.  HENT: No nasal discharge.  Head: Normocephalic and atraumatic.  Eyes: Pupils are equal and round. Right eye exhibits no discharge. Left eye exhibits no discharge.  Neck: Normal range of motion. Neck supple. No JVD present. No thyromegaly present.  Cardiovascular: Normal rate, regular rhythm, normal heart sounds. Exam reveals no gallop and no friction rub. No murmur heard.  Pulmonary/Chest: Effort normal and breath sounds normal. No stridor. No respiratory distress. She has no wheezes. She has no rales. She exhibits no tenderness.  Abdominal: Soft. Bowel sounds are normal. She exhibits no distension. There is no tenderness. There is no rebound and no guarding.  Musculoskeletal: Normal range of motion. She exhibits no edema and no tenderness.  Neurological: She is alert and oriented to person, place, and time. Coordination  normal.  Skin: Skin is warm and dry. No rash noted. She is not diaphoretic. No erythema. No pallor.  Psychiatric: Poor insight.     ASSESSMENT AND PLAN

## 2013-09-16 NOTE — Patient Instructions (Signed)
Labs today.   Continue same medications.   Follow up in 4 months.

## 2013-09-17 DIAGNOSIS — E785 Hyperlipidemia, unspecified: Secondary | ICD-10-CM | POA: Insufficient documentation

## 2013-09-17 NOTE — Assessment & Plan Note (Addendum)
She appears to be euvolemic on current small dose of Lasix 20 mg once daily. Continue other medications including lisinopril, metoprolol succinate and spironolactone. Check basic metabolic profile today.

## 2013-09-17 NOTE — Assessment & Plan Note (Signed)
Continue treatment with atorvastatin given known history of coronary artery disease. She will need a followup lipid and liver profile in the near future.

## 2013-09-17 NOTE — Assessment & Plan Note (Signed)
She is doing well with no symptoms suggestive of angina. Continue medical therapy including dual antiplatelet therapy.

## 2013-09-20 LAB — BASIC METABOLIC PANEL
BUN/Creatinine Ratio: 22 (ref 11–26)
Calcium: 9.6 mg/dL (ref 8.6–10.2)
Creatinine, Ser: 1.76 mg/dL — ABNORMAL HIGH (ref 0.57–1.00)
GFR calc Af Amer: 33 mL/min/{1.73_m2} — ABNORMAL LOW (ref >59)
GFR calc non Af Amer: 28 mL/min/{1.73_m2} — ABNORMAL LOW (ref >59)
Potassium: 5.3 mmol/L — ABNORMAL HIGH (ref 3.5–5.2)
Sodium: 142 mmol/L (ref 134–144)

## 2013-09-21 ENCOUNTER — Other Ambulatory Visit: Payer: Self-pay | Admitting: *Deleted

## 2013-09-21 DIAGNOSIS — I5022 Chronic systolic (congestive) heart failure: Secondary | ICD-10-CM

## 2013-09-21 MED ORDER — FUROSEMIDE 20 MG PO TABS: ORAL_TABLET | ORAL | Status: DC

## 2013-09-21 MED ORDER — SPIRONOLACTONE 25 MG PO TABS: 12.5000 mg | ORAL_TABLET | Freq: Every day | ORAL | Status: DC

## 2013-09-28 ENCOUNTER — Encounter: Payer: Self-pay | Admitting: Internal Medicine

## 2013-09-28 ENCOUNTER — Ambulatory Visit (INDEPENDENT_AMBULATORY_CARE_PROVIDER_SITE_OTHER): Payer: Medicare Other | Admitting: Internal Medicine

## 2013-09-28 VITALS — BP 100/60 | HR 72 | Temp 96.9°F | Wt 129.2 lb

## 2013-09-28 DIAGNOSIS — N184 Chronic kidney disease, stage 4 (severe): Secondary | ICD-10-CM

## 2013-09-28 DIAGNOSIS — E1149 Type 2 diabetes mellitus with other diabetic neurological complication: Secondary | ICD-10-CM

## 2013-09-28 DIAGNOSIS — F068 Other specified mental disorders due to known physiological condition: Secondary | ICD-10-CM

## 2013-09-28 DIAGNOSIS — N185 Chronic kidney disease, stage 5: Secondary | ICD-10-CM | POA: Insufficient documentation

## 2013-09-28 DIAGNOSIS — F0280 Dementia in other diseases classified elsewhere without behavioral disturbance: Secondary | ICD-10-CM

## 2013-09-28 DIAGNOSIS — I5022 Chronic systolic (congestive) heart failure: Secondary | ICD-10-CM

## 2013-09-28 DIAGNOSIS — R634 Abnormal weight loss: Secondary | ICD-10-CM

## 2013-09-28 DIAGNOSIS — Z23 Encounter for immunization: Secondary | ICD-10-CM

## 2013-09-28 NOTE — Assessment & Plan Note (Signed)
Improved on the mirtazapine  Will continue

## 2013-09-28 NOTE — Assessment & Plan Note (Signed)
Sugars still seem to be fine

## 2013-09-28 NOTE — Progress Notes (Signed)
Subjective:    Patient ID: [name] Short, female    DOB: Dec 07, 1940, 72 y.o.   MRN: [id number]  HPI Here with husband  No problems with last adjustment in meds Appetite has improved according to husband  Diuretics were decreased by Dr Kirke Corin due to renal insuff No edema Breathing is okay Sleeps on 2 pillows--this is stable No PND  Independent with ADLs Shops with her husband They have "visiting angels" 11 hours a day for instrumental ADLs  No chest pain No palpitations No dizziness or syncope  Current Outpatient Prescriptions on File Prior to Visit  Medication Sig Dispense Refill  . aspirin 81 MG tablet Take 81 mg by mouth daily.      Marland Kitchen atorvastatin (LIPITOR) 40 MG tablet Take 40 mg by mouth daily.      . citalopram (CELEXA) 20 MG tablet Take 1 tablet (20 mg total) by mouth daily.  30 tablet  11  . clopidogrel (PLAVIX) 75 MG tablet Take 1 tablet (75 mg total) by mouth daily.  90 tablet  3  . donepezil (ARICEPT) 5 MG tablet Take 5 mg by mouth at bedtime.      . furosemide (LASIX) 20 MG tablet 20mg  tablet every other day  30 tablet  6  . glucose blood (ONE TOUCH TEST STRIPS) test strip Check blood sugar twice a week and as directed. Dx 250.60  100 each  1  . lisinopril (PRINIVIL,ZESTRIL) 10 MG tablet Take 1 tablet (10 mg total) by mouth daily.  90 tablet  3  . memantine (NAMENDA) 10 MG tablet Take 10 mg by mouth 2 (two) times daily.      . metFORMIN (GLUCOPHAGE) 1000 MG tablet Take 0.5 tablets (500 mg total) by mouth 2 (two) times daily with a meal.  90 tablet  3  . metoprolol succinate (TOPROL-XL) 25 MG 24 hr tablet Take 25 mg by mouth daily.       . mirtazapine (REMERON) 15 MG tablet Take 1 tablet (15 mg total) by mouth at bedtime.  30 tablet  5  . nitroGLYCERIN (NITROSTAT) 0.4 MG SL tablet Place 1 tablet (0.4 mg total) under the tongue every 5 (five) minutes as needed for chest pain.  90 tablet  3  . spironolactone (ALDACTONE) 25 MG tablet Take 0.5 tablets (12.5 mg total) by  mouth daily.  90 tablet  3   No current facility-administered medications on file prior to visit.    Allergies  Allergen Reactions  . Ciprofloxacin Rash    Past Medical History  Diagnosis Date  . Hypercholesteremia   . CAD (coronary artery disease)prior stents LAD. Cx Diagonal 2003   . Occlusion and stenosis of carotid artery with cerebral infarction     <15% R <50% L 2012  . Hypertension   . Mitral regurgitation     Mod-severe  . Dementia   . Type II or unspecified type diabetes mellitus with neurological manifestations, not stated as uncontrolled(250.60)   . Sleep disturbance, unspecified   . Anxiety   . Osteoarthritis, shoulder   . Chronic kidney disease   . MI (myocardial infarction)     x3  . Asthma     hx  . PVC (premature ventricular contraction)   . Chronic systolic heart failure 05/2013    EF 25-30% with moderate to severe mitral regurgitation    Past Surgical History  Procedure Laterality Date  . Tonsillectomy    . Cardiac catheterization  2003  . Cardiac catheterization  2011  .  Cardiac catheterization  2012    Family History  Problem Relation Age of Onset  . Other Mother     Diabetes Mellitus  . Cancer Father   . Coronary artery disease Other   . Alzheimer's disease Sister   . Heart disease Brother   . Cancer Brother     brain tumor  . Hypertension Maternal Grandmother     History   Social History  . Marital Status: Married    Spouse Name: N/A    Number of Children: 3  . Years of Education: N/A   Occupational History  . Not on file.   Social History Main Topics  . Smoking status: Never Smoker   . Smokeless tobacco: Never Used  . Alcohol Use: No     Comment: Wine occcassionally  . Drug Use: No  . Sexual Activity: Not on file   Other Topics Concern  . Not on file   Social History Narrative   Has living will   Daughter Victorino Dike is health care POA   Has DNR order   No tube feeds if cognitively unaware   Review of  Systems Notes easy bruising--discussed from the clopidogrel    Objective:   Physical Exam  Constitutional: She appears well-developed and well-nourished. No distress.  Neck: Normal range of motion. Neck supple.  Cardiovascular: Normal rate, regular rhythm and normal heart sounds.  Exam reveals no gallop.   No murmur heard. Pulmonary/Chest: Effort normal and breath sounds normal. No respiratory distress. She has no wheezes. She has no rales.  Musculoskeletal: She exhibits no edema and no tenderness.  Lymphadenopathy:    She has no cervical adenopathy.  Psychiatric: She has a normal mood and affect.  Interacts fairly appropriately          Assessment & Plan:

## 2013-09-28 NOTE — Progress Notes (Signed)
Pre-visit discussion using our clinic review tool. No additional management support is needed unless otherwise documented below in the visit note.  

## 2013-09-28 NOTE — Assessment & Plan Note (Signed)
Hopefully will improve on the lower diuretic doses

## 2013-09-28 NOTE — Assessment & Plan Note (Signed)
Doing well despite decreased diuretic doses No changes for now

## 2013-09-28 NOTE — Addendum Note (Signed)
Addended by: Roena Malady on: 09/28/2013 11:43 AM   Modules accepted: Orders

## 2013-09-28 NOTE — Patient Instructions (Signed)
Please set up your eye exam to be sure the diabetes hasn't affected your eyes.

## 2013-09-28 NOTE — Assessment & Plan Note (Signed)
Mild to moderate Seems stable Discussed her meds---will continue for now

## 2013-09-29 LAB — LIPID PANEL
HDL: 40.1 mg/dL (ref >39.00)
LDL Cholesterol: 81 mg/dL (ref 0–99)
Total CHOL/HDL Ratio: 4
VLDL: 24.8 mg/dL (ref 0.0–40.0)

## 2013-09-29 LAB — BASIC METABOLIC PANEL
CO2: 26 mEq/L (ref 19–32)
Calcium: 9.3 mg/dL (ref 8.4–10.5)
Chloride: 106 mEq/L (ref 96–112)
Creatinine, Ser: 1.6 mg/dL — ABNORMAL HIGH (ref 0.4–1.2)
GFR: 34.13 mL/min — ABNORMAL LOW (ref >60.00)
Sodium: 140 mEq/L (ref 135–145)

## 2013-09-29 LAB — HEPATIC FUNCTION PANEL
ALT: 11 U/L (ref 0–35)
Alkaline Phosphatase: 79 U/L (ref 39–117)
Bilirubin, Direct: 0 mg/dL (ref 0.0–0.3)
Total Protein: 7.1 g/dL (ref 6.0–8.3)

## 2013-09-29 LAB — HEMOGLOBIN A1C: Hgb A1c MFr Bld: 6.3 % (ref 4.6–6.5)

## 2013-09-30 ENCOUNTER — Encounter: Payer: Self-pay | Admitting: *Deleted

## 2013-10-04 ENCOUNTER — Other Ambulatory Visit: Payer: No Typology Code available for payment source

## 2013-10-10 ENCOUNTER — Other Ambulatory Visit: Payer: Self-pay | Admitting: Physician Assistant

## 2013-10-17 ENCOUNTER — Ambulatory Visit (INDEPENDENT_AMBULATORY_CARE_PROVIDER_SITE_OTHER): Payer: Medicare Other | Admitting: Internal Medicine

## 2013-10-17 ENCOUNTER — Encounter: Payer: Self-pay | Admitting: Internal Medicine

## 2013-10-17 ENCOUNTER — Telehealth: Payer: Self-pay

## 2013-10-17 VITALS — BP 120/60 | HR 78 | Temp 98.1°F | Wt 130.0 lb

## 2013-10-17 DIAGNOSIS — R0602 Shortness of breath: Secondary | ICD-10-CM

## 2013-10-17 NOTE — Assessment & Plan Note (Signed)
Brief in the evening and night Weight stable but still most likely a fluid phenomenon Renal function has been stable Will increase the furosemide back to daily

## 2013-10-17 NOTE — Telephone Encounter (Signed)
Pt caretaker called, states pt is not eating, restless nights, SOB, would like to talk with a nurse. Please call.

## 2013-10-17 NOTE — Telephone Encounter (Signed)
Patients husband stated that patient was up all night feeling restless and very short of breath. She has not had an appetite and is feeling very weak. Denies chest pain, weight gain, and swelling. Denies fever and GI issues other than loss of appetite. Patient states she is feeling better this morning but is concerned about the way she felt last. Patients husband was urged to make appt with her primary doctor to discuss these issues.

## 2013-10-17 NOTE — Progress Notes (Signed)
Pre-visit discussion using our clinic review tool. No additional management support is needed unless otherwise documented below in the visit note.  

## 2013-10-17 NOTE — Progress Notes (Signed)
Subjective:    Patient ID: [name] Short, female    DOB: 1940/11/06, 73 y.o.   MRN: [id number]  HPI Here with husband and aide  Was very restless last night and weak Some trouble breathing Better this AM Trouble started sometime earlier in day--like afternoon. Then worsened and had trouble initiating sleep  No chest pain now States she awoke feeling good  Current Outpatient Prescriptions on File Prior to Visit  Medication Sig Dispense Refill  . aspirin 81 MG tablet Take 81 mg by mouth daily.      Marland Kitchen atorvastatin (LIPITOR) 40 MG tablet Take 40 mg by mouth daily.      . citalopram (CELEXA) 20 MG tablet Take 1 tablet (20 mg total) by mouth daily.  30 tablet  11  . clopidogrel (PLAVIX) 75 MG tablet Take 1 tablet (75 mg total) by mouth daily.  90 tablet  3  . donepezil (ARICEPT) 5 MG tablet Take 5 mg by mouth at bedtime.      . furosemide (LASIX) 20 MG tablet 20mg  tablet every other day  30 tablet  6  . glucose blood (ONE TOUCH TEST STRIPS) test strip Check blood sugar twice a week and as directed. Dx 250.60  100 each  1  . lisinopril (PRINIVIL,ZESTRIL) 10 MG tablet Take 1 tablet (10 mg total) by mouth daily.  90 tablet  3  . memantine (NAMENDA) 10 MG tablet Take 10 mg by mouth 2 (two) times daily.      . metFORMIN (GLUCOPHAGE) 1000 MG tablet Take 0.5 tablets (500 mg total) by mouth 2 (two) times daily with a meal.  90 tablet  3  . metoprolol succinate (TOPROL-XL) 25 MG 24 hr tablet TAKE 1 TABLET (25 MG TOTAL) BY MOUTH DAILY. TAKE WITH OR IMMEDIATELY FOLLOWING A MEAL.  30 tablet  6  . mirtazapine (REMERON) 15 MG tablet Take 1 tablet (15 mg total) by mouth at bedtime.  30 tablet  5  . nitroGLYCERIN (NITROSTAT) 0.4 MG SL tablet Place 1 tablet (0.4 mg total) under the tongue every 5 (five) minutes as needed for chest pain.  90 tablet  3  . spironolactone (ALDACTONE) 25 MG tablet Take 0.5 tablets (12.5 mg total) by mouth daily.  90 tablet  3   No current facility-administered medications on  file prior to visit.    Allergies  Allergen Reactions  . Ciprofloxacin Rash    Past Medical History  Diagnosis Date  . Hypercholesteremia   . CAD (coronary artery disease)prior stents LAD. Cx Diagonal 2003   . Occlusion and stenosis of carotid artery with cerebral infarction     <15% R <50% L 2012  . Hypertension   . Mitral regurgitation     Mod-severe  . Dementia   . Type II or unspecified type diabetes mellitus with neurological manifestations, not stated as uncontrolled(250.60)   . Sleep disturbance, unspecified   . Anxiety   . Osteoarthritis, shoulder   . Chronic kidney disease   . MI (myocardial infarction)     x3  . Asthma     hx  . PVC (premature ventricular contraction)   . Chronic systolic heart failure 05/2013    EF 25-30% with moderate to severe mitral regurgitation    Past Surgical History  Procedure Laterality Date  . Tonsillectomy    . Cardiac catheterization  2003  . Cardiac catheterization  2011  . Cardiac catheterization  2012    Family History  Problem Relation Age of Onset  .  Other Mother     Diabetes Mellitus  . Cancer Father   . Coronary artery disease Other   . Alzheimer's disease Sister   . Heart disease Brother   . Cancer Brother     brain tumor  . Hypertension Maternal Grandmother     History   Social History  . Marital Status: Married    Spouse Name: N/A    Number of Children: 3  . Years of Education: N/A   Occupational History  . Not on file.   Social History Main Topics  . Smoking status: Never Smoker   . Smokeless tobacco: Never Used  . Alcohol Use: No     Comment: Wine occcassionally  . Drug Use: No  . Sexual Activity: Not on file   Other Topics Concern  . Not on file   Social History Narrative   Has living will   Daughter Victorino DikeJennifer is health care POA   Has DNR order   No tube feeds if cognitively unaware   Review of Systems Didn't take the furosemide yesterday Appetite is not great---not really a change.  Will eat but doesn't finish meals (needs aides to prompt and even feed her) No fever No sig cough    Objective:   Physical Exam  Constitutional: She appears well-developed and well-nourished. No distress.  Neck: Normal range of motion. Neck supple. No thyromegaly present.  Cardiovascular: Normal rate, regular rhythm and normal heart sounds.  Exam reveals no gallop.   No murmur heard. Pulmonary/Chest: Effort normal and breath sounds normal. No respiratory distress. She has no wheezes. She has no rales.  Musculoskeletal: She exhibits no edema.  Lymphadenopathy:    She has no cervical adenopathy.          Assessment & Plan:

## 2013-10-17 NOTE — Telephone Encounter (Signed)
Agree. Thanks

## 2013-10-17 NOTE — Telephone Encounter (Signed)
Informed patient that Dr. Kirke CorinArida recommends follow up with [facility].

## 2013-10-17 NOTE — Patient Instructions (Signed)
Please change the furosemide back to 20mg  every day.

## 2013-11-14 ENCOUNTER — Ambulatory Visit (INDEPENDENT_AMBULATORY_CARE_PROVIDER_SITE_OTHER): Payer: Medicare Other | Admitting: Cardiovascular Disease

## 2013-11-14 ENCOUNTER — Encounter: Payer: Self-pay | Admitting: Cardiovascular Disease

## 2013-11-14 VITALS — BP 100/62 | HR 66 | Ht 64.5 in | Wt 131.2 lb

## 2013-11-14 DIAGNOSIS — R0989 Other specified symptoms and signs involving the circulatory and respiratory systems: Secondary | ICD-10-CM

## 2013-11-14 DIAGNOSIS — I5022 Chronic systolic (congestive) heart failure: Secondary | ICD-10-CM

## 2013-11-14 DIAGNOSIS — R0600 Dyspnea, unspecified: Secondary | ICD-10-CM

## 2013-11-14 DIAGNOSIS — R0609 Other forms of dyspnea: Secondary | ICD-10-CM

## 2013-11-14 DIAGNOSIS — I2510 Atherosclerotic heart disease of native coronary artery without angina pectoris: Secondary | ICD-10-CM

## 2013-11-14 NOTE — Assessment & Plan Note (Addendum)
She seems to be slightly fluid overloaded. She had recent problems with worsening kidney function with increased dose of diuretics. Lasix was recently increased back to 20 mg once daily. Check basic metabolic profile today and BNP. She also appears to be very pale. I recommend checking CBC again showed no significant anemia. She denies bleeding. If renal function is worse, I might discontinue Aldactone.

## 2013-11-14 NOTE — Assessment & Plan Note (Signed)
No reported chest pain although it is difficult to obtain an accurate history from the patient. Continue medical therapy for now.

## 2013-11-14 NOTE — Progress Notes (Signed)
Primary care physician: Dr. Alphonsus Sias  HPI  This is a 73 year old female who is here today for a followup visit regarding CAD and chronic systolic heart failure. She has known history of coronary artery disease with prior stenting of her diagonal LAD and posterolateral branch. Cardiac catheterization in 12/12 showed patent stents .  Echocardiogram 9/12 demonstrated normal left ventricular function . Myoview 4/14  showed evidence of prior anterior MI with ejection fraction of 39%. The patient suffers from severe dementia. She was hospitalized in July for hyponatremia . She had another hospitalization in August for a small non-ST elevation myocardial infarction. She was treated medically as per her choice and family decision given her advanced dementia.   Echocardiogram in 06/2013 showed an ejection fraction of 25-30% with moderate to severe mitral regurgitation. She was treated with Tolvaptan for hyponatermia . Hydrochlorothiazide was discontinued.  The dose of Lasix was gradually decreased to 20 mg every other day due to worsening renal function. Shortly after that she had worsening dyspnea and orthopnea. The dose was increased back to 20 mg once daily by Dr. Alphonsus Sias.  The patient herself is not able to provide history due to her dementia. According to the husband, she has been weak and more dyspneic.    Allergies  Allergen Reactions  . Ciprofloxacin Rash     Current Outpatient Prescriptions on File Prior to Visit  Medication Sig Dispense Refill  . aspirin 81 MG tablet Take 81 mg by mouth daily.      Marland Kitchen atorvastatin (LIPITOR) 40 MG tablet Take 40 mg by mouth daily.      . citalopram (CELEXA) 20 MG tablet Take 1 tablet (20 mg total) by mouth daily.  30 tablet  11  . clopidogrel (PLAVIX) 75 MG tablet Take 1 tablet (75 mg total) by mouth daily.  90 tablet  3  . donepezil (ARICEPT) 5 MG tablet Take 5 mg by mouth at bedtime.      . furosemide (LASIX) 20 MG tablet Take 20 mg by mouth daily.      Marland Kitchen  glucose blood (ONE TOUCH TEST STRIPS) test strip Check blood sugar twice a week and as directed. Dx 250.60  100 each  1  . lisinopril (PRINIVIL,ZESTRIL) 10 MG tablet Take 1 tablet (10 mg total) by mouth daily.  90 tablet  3  . memantine (NAMENDA) 10 MG tablet Take 10 mg by mouth 2 (two) times daily.      . metFORMIN (GLUCOPHAGE) 1000 MG tablet Take 0.5 tablets (500 mg total) by mouth 2 (two) times daily with a meal.  90 tablet  3  . metoprolol succinate (TOPROL-XL) 25 MG 24 hr tablet TAKE 1 TABLET (25 MG TOTAL) BY MOUTH DAILY. TAKE WITH OR IMMEDIATELY FOLLOWING A MEAL.  30 tablet  6  . mirtazapine (REMERON) 15 MG tablet Take 1 tablet (15 mg total) by mouth at bedtime.  30 tablet  5  . nitroGLYCERIN (NITROSTAT) 0.4 MG SL tablet Place 1 tablet (0.4 mg total) under the tongue every 5 (five) minutes as needed for chest pain.  90 tablet  3  . spironolactone (ALDACTONE) 25 MG tablet Take 0.5 tablets (12.5 mg total) by mouth daily.  90 tablet  3   No current facility-administered medications on file prior to visit.     Past Medical History  Diagnosis Date  . Hypercholesteremia   . CAD (coronary artery disease)prior stents LAD. Cx Diagonal 2003   . Occlusion and stenosis of carotid artery with cerebral infarction     <  15% R <50% L 2012  . Hypertension   . Mitral regurgitation     Mod-severe  . Dementia   . Type II or unspecified type diabetes mellitus with neurological manifestations, not stated as uncontrolled   . Sleep disturbance, unspecified   . Anxiety   . Osteoarthritis, shoulder   . Chronic kidney disease   . MI (myocardial infarction)     x3  . Asthma     hx  . PVC (premature ventricular contraction)   . Chronic systolic heart failure 05/2013    EF 25-30% with moderate to severe mitral regurgitation     Past Surgical History  Procedure Laterality Date  . Tonsillectomy    . Cardiac catheterization  2003  . Cardiac catheterization  2011  . Cardiac catheterization  2012      Family History  Problem Relation Age of Onset  . Other Mother     Diabetes Mellitus  . Cancer Father   . Coronary artery disease Other   . Alzheimer's disease Sister   . Heart disease Brother   . Cancer Brother     brain tumor  . Hypertension Maternal Grandmother      History   Social History  . Marital Status: Married    Spouse Name: N/A    Number of Children: 3  . Years of Education: N/A   Occupational History  . Not on file.   Social History Main Topics  . Smoking status: Never Smoker   . Smokeless tobacco: Never Used  . Alcohol Use: No     Comment: Wine occcassionally  . Drug Use: No  . Sexual Activity: Not on file   Other Topics Concern  . Not on file   Social History Narrative   Has living will   Daughter Victorino DikeJennifer is health care POA   Has DNR order   No tube feeds if cognitively unaware      PHYSICAL EXAM   BP 100/62  Pulse 66  Ht 5' 4.5" (1.638 m)  Wt 131 lb 4 oz (59.535 kg)  BMI 22.19 kg/m2  Constitutional: She is not oriented to time. She appears well-developed and well-nourished. No distress.  HENT: No nasal discharge.  Head: Normocephalic and atraumatic.  Eyes: Pupils are equal and round. Right eye exhibits no discharge. Left eye exhibits no discharge.  Neck: Normal range of motion. Neck supple. No JVD present. No thyromegaly present.  Cardiovascular: Normal rate, regular rhythm with premature beats, normal heart sounds. Exam reveals no gallop and no friction rub. No murmur heard.  Pulmonary/Chest: Effort normal and breath sounds normal. No stridor. No respiratory distress. She has no wheezes. She has no rales. She exhibits no tenderness.  Abdominal: Soft. Bowel sounds are normal. She exhibits no distension. There is no tenderness. There is no rebound and no guarding.  Musculoskeletal: Normal range of motion. She exhibits trace edema and no tenderness.  Neurological: She is alert and oriented to person, place, and time. Coordination  normal.  Skin: Skin is warm and dry. No rash noted. She is not diaphoretic. No erythema. No pallor.  Psychiatric: Poor insight.     ASSESSMENT AND PLAN

## 2013-11-14 NOTE — Patient Instructions (Signed)
Labs today.  Continue same medications.  Follow up in 3 months.  

## 2013-11-15 ENCOUNTER — Other Ambulatory Visit: Payer: Self-pay | Admitting: Cardiovascular Disease

## 2013-11-16 LAB — BASIC METABOLIC PANEL
BUN / CREAT RATIO: 23 (ref 11–26)
BUN: 37 mg/dL — ABNORMAL HIGH (ref 8–27)
CALCIUM: 9.3 mg/dL (ref 8.7–10.3)
CHLORIDE: 100 mmol/L (ref 97–108)
CO2: 21 mmol/L (ref 18–29)
Creatinine, Ser: 1.62 mg/dL — ABNORMAL HIGH (ref 0.57–1.00)
GFR calc Af Amer: 36 mL/min/{1.73_m2} — ABNORMAL LOW (ref >59)
GFR calc non Af Amer: 31 mL/min/{1.73_m2} — ABNORMAL LOW (ref >59)
Glucose: 95 mg/dL (ref 65–99)
Potassium: 4.6 mmol/L (ref 3.5–5.2)
SODIUM: 139 mmol/L (ref 134–144)

## 2013-11-16 LAB — CBC WITH DIFFERENTIAL/PLATELET
BASOS ABS: 0 10*3/uL (ref 0.0–0.2)
Basos: 0 %
EOS: 3 %
Eosinophils Absolute: 0.3 10*3/uL (ref 0.0–0.4)
HEMATOCRIT: 28.1 % — AB (ref 34.0–46.6)
Hemoglobin: 8.6 g/dL — ABNORMAL LOW (ref 11.1–15.9)
IMMATURE GRANS (ABS): 0 10*3/uL (ref 0.0–0.1)
IMMATURE GRANULOCYTES: 0 %
LYMPHS ABS: 1.2 10*3/uL (ref 0.7–3.1)
Lymphs: 15 %
MCH: 24.2 pg — ABNORMAL LOW (ref 26.6–33.0)
MCHC: 30.6 g/dL — AB (ref 31.5–35.7)
MCV: 79 fL (ref 79–97)
Monocytes Absolute: 0.6 10*3/uL (ref 0.1–0.9)
Monocytes: 7 %
NEUTROS PCT: 75 %
Neutrophils Absolute: 5.6 10*3/uL (ref 1.4–7.0)
RBC: 3.55 x10E6/uL — ABNORMAL LOW (ref 3.77–5.28)
RDW: 16.8 % — AB (ref 12.3–15.4)
WBC: 7.6 10*3/uL (ref 3.4–10.8)

## 2013-11-16 LAB — BRAIN NATRIURETIC PEPTIDE: BNP: 2157.3 pg/mL — ABNORMAL HIGH (ref 0.0–100.0)

## 2013-11-18 ENCOUNTER — Other Ambulatory Visit: Payer: Self-pay | Admitting: *Deleted

## 2013-11-18 ENCOUNTER — Telehealth: Payer: Self-pay

## 2013-11-18 ENCOUNTER — Telehealth: Payer: Self-pay | Admitting: *Deleted

## 2013-11-18 NOTE — Telephone Encounter (Signed)
Patient's husband called wanting lab results. Please call

## 2013-11-18 NOTE — Telephone Encounter (Signed)
Reviewed results with patient. 

## 2013-11-18 NOTE — Telephone Encounter (Signed)
Pt husband called and would like lab results

## 2013-11-21 ENCOUNTER — Ambulatory Visit (INDEPENDENT_AMBULATORY_CARE_PROVIDER_SITE_OTHER): Payer: Medicare Other | Admitting: Internal Medicine

## 2013-11-21 ENCOUNTER — Encounter: Payer: Self-pay | Admitting: Internal Medicine

## 2013-11-21 VITALS — BP 100/70 | HR 87 | Temp 97.8°F | Wt 131.0 lb

## 2013-11-21 DIAGNOSIS — D649 Anemia, unspecified: Secondary | ICD-10-CM

## 2013-11-21 DIAGNOSIS — D509 Iron deficiency anemia, unspecified: Secondary | ICD-10-CM | POA: Insufficient documentation

## 2013-11-21 NOTE — Progress Notes (Signed)
Subjective:    Patient ID: [name] Short, female    DOB: 1941-04-04, 73 y.o.   MRN: [id number]  HPI Here with husband Concerns about hemoglobin of 8.6  Not eating well Feels tired regularly  No vomiting No blood in urine or stool No injuries or other blood loss  Current Outpatient Prescriptions on File Prior to Visit  Medication Sig Dispense Refill  . aspirin 81 MG tablet Take 81 mg by mouth daily.      Marland Kitchen atorvastatin (LIPITOR) 40 MG tablet Take 40 mg by mouth daily.      . citalopram (CELEXA) 20 MG tablet Take 1 tablet (20 mg total) by mouth daily.  30 tablet  11  . donepezil (ARICEPT) 5 MG tablet Take 5 mg by mouth at bedtime.      . furosemide (LASIX) 20 MG tablet Take 20 mg by mouth daily.      Marland Kitchen glucose blood (ONE TOUCH TEST STRIPS) test strip Check blood sugar twice a week and as directed. Dx 250.60  100 each  1  . lisinopril (PRINIVIL,ZESTRIL) 10 MG tablet Take 1 tablet (10 mg total) by mouth daily.  90 tablet  3  . memantine (NAMENDA) 10 MG tablet Take 10 mg by mouth 2 (two) times daily.      . metFORMIN (GLUCOPHAGE) 1000 MG tablet Take 0.5 tablets (500 mg total) by mouth 2 (two) times daily with a meal.  90 tablet  3  . metoprolol succinate (TOPROL-XL) 25 MG 24 hr tablet TAKE 1 TABLET (25 MG TOTAL) BY MOUTH DAILY. TAKE WITH OR IMMEDIATELY FOLLOWING A MEAL.  30 tablet  6  . mirtazapine (REMERON) 15 MG tablet Take 1 tablet (15 mg total) by mouth at bedtime.  30 tablet  5  . nitroGLYCERIN (NITROSTAT) 0.4 MG SL tablet Place 1 tablet (0.4 mg total) under the tongue every 5 (five) minutes as needed for chest pain.  90 tablet  3  . spironolactone (ALDACTONE) 25 MG tablet Take 0.5 tablets (12.5 mg total) by mouth daily.  90 tablet  3   No current facility-administered medications on file prior to visit.    Allergies  Allergen Reactions  . Ciprofloxacin Rash    Past Medical History  Diagnosis Date  . Hypercholesteremia   . CAD (coronary artery disease)prior stents LAD. Cx  Diagonal 2003   . Occlusion and stenosis of carotid artery with cerebral infarction     <15% R <50% L 2012  . Hypertension   . Mitral regurgitation     Mod-severe  . Dementia   . Type II or unspecified type diabetes mellitus with neurological manifestations, not stated as uncontrolled   . Sleep disturbance, unspecified   . Anxiety   . Osteoarthritis, shoulder   . Chronic kidney disease   . MI (myocardial infarction)     x3  . Asthma     hx  . PVC (premature ventricular contraction)   . Chronic systolic heart failure 05/2013    EF 25-30% with moderate to severe mitral regurgitation    Past Surgical History  Procedure Laterality Date  . Tonsillectomy    . Cardiac catheterization  2003  . Cardiac catheterization  2011  . Cardiac catheterization  2012    Family History  Problem Relation Age of Onset  . Other Mother     Diabetes Mellitus  . Cancer Father   . Coronary artery disease Other   . Alzheimer's disease Sister   . Heart disease Brother   .  Cancer Brother     brain tumor  . Hypertension Maternal Grandmother     History   Social History  . Marital Status: Married    Spouse Name: N/A    Number of Children: 3  . Years of Education: N/A   Occupational History  . Not on file.   Social History Main Topics  . Smoking status: Never Smoker   . Smokeless tobacco: Never Used  . Alcohol Use: No     Comment: Wine occcassionally  . Drug Use: No  . Sexual Activity: Not on file   Other Topics Concern  . Not on file   Social History Narrative   Has living will   Daughter Victorino DikeJennifer is health care POA   Has DNR order   No tube feeds if cognitively unaware   Review of Systems Weight is stable    Objective:   Physical Exam  Constitutional: She appears well-developed. No distress.  Neck: Normal range of motion.  Cardiovascular: Normal rate, regular rhythm and normal heart sounds.   occ skips  Pulmonary/Chest: Effort normal and breath sounds normal. No  respiratory distress. She has no wheezes. She has no rales.  Musculoskeletal: She exhibits no edema.  Lymphadenopathy:    She has no cervical adenopathy.          Assessment & Plan:

## 2013-11-21 NOTE — Progress Notes (Signed)
Pre-visit discussion using our clinic review tool. No additional management support is needed unless otherwise documented below in the visit note.  

## 2013-11-21 NOTE — Assessment & Plan Note (Signed)
I suspect this is mostly due to her CRF I don't have baseline levels available Did take B12 in the past  Will check labs to make sure she doesn't need iron or B12 I would generally not recommend erythropoietin at this hemoglobin level, but if she continues to feel poorly. We could consider this if she doesn't improve

## 2013-11-22 LAB — CBC WITH DIFFERENTIAL/PLATELET
BASOS PCT: 0.3 % (ref 0.0–3.0)
Basophils Absolute: 0 10*3/uL (ref 0.0–0.1)
Eosinophils Absolute: 0.1 10*3/uL (ref 0.0–0.7)
Eosinophils Relative: 1.4 % (ref 0.0–5.0)
HCT: 29 % — ABNORMAL LOW (ref 36.0–46.0)
Hemoglobin: 8.8 g/dL — ABNORMAL LOW (ref 12.0–15.0)
Lymphocytes Relative: 11.9 % — ABNORMAL LOW (ref 12.0–46.0)
Lymphs Abs: 1 10*3/uL (ref 0.7–4.0)
MCHC: 30.4 g/dL (ref 30.0–36.0)
MCV: 78.2 fl (ref 78.0–100.0)
MONO ABS: 0.6 10*3/uL (ref 0.1–1.0)
Monocytes Relative: 6.5 % (ref 3.0–12.0)
NEUTROS PCT: 79.9 % — AB (ref 43.0–77.0)
Neutro Abs: 6.9 10*3/uL (ref 1.4–7.7)
Platelets: 237 10*3/uL (ref 150.0–400.0)
RBC: 3.72 Mil/uL — ABNORMAL LOW (ref 3.87–5.11)
RDW: 18.5 % — AB (ref 11.5–14.6)
WBC: 8.6 10*3/uL (ref 4.5–10.5)

## 2013-11-22 LAB — B12 AND FOLATE PANEL
Folate: 9.4 ng/mL (ref >5.9)
VITAMIN B 12: 477 pg/mL (ref 211–911)

## 2013-11-22 LAB — IBC PANEL
IRON: 19 ug/dL — AB (ref 42–145)
Saturation Ratios: 4.5 % — ABNORMAL LOW (ref 20.0–50.0)
Transferrin: 302.9 mg/dL (ref 212.0–360.0)

## 2013-11-22 LAB — FERRITIN: Ferritin: 22.2 ng/mL (ref 10.0–291.0)

## 2013-11-25 ENCOUNTER — Other Ambulatory Visit: Payer: Self-pay | Admitting: *Deleted

## 2013-11-25 MED ORDER — FERROUS SULFATE 325 (65 FE) MG PO TABS: 325.0000 mg | ORAL_TABLET | Freq: Every day | ORAL | Status: DC

## 2013-12-02 ENCOUNTER — Telehealth: Payer: Self-pay

## 2013-12-02 NOTE — Telephone Encounter (Signed)
Pt husband called and states pt has "big time puffy feet" Please call.

## 2013-12-02 NOTE — Telephone Encounter (Signed)
Attempted to call patient. No answer, no voicemail.

## 2013-12-05 NOTE — Telephone Encounter (Signed)
Patients husband called and stated that her feet are very "puffy". She is very tired and gets short of breath when she walks. Patients husband  stated all she eats are potato chips and diet soda. She does not have much of an appetite so he is not sure what do to do.  Patient and husband instructed to find a lower sodium substitute to chips and soda.

## 2013-12-06 NOTE — Telephone Encounter (Signed)
Increase lasix to 40 mg once daily. Schedule a follow up visit.

## 2013-12-07 MED ORDER — FUROSEMIDE 20 MG PO TABS: 40.0000 mg | ORAL_TABLET | Freq: Every day | ORAL | Status: DC

## 2013-12-07 NOTE — Telephone Encounter (Signed)
Informed patient, husband and caregiver that per Dr. Kirke CorinArida "Increase lasix to 40 mg once daily. Schedule a follow up visit." They verbalized understanding and made appt on 12/12/13.

## 2013-12-12 ENCOUNTER — Other Ambulatory Visit: Payer: Self-pay | Admitting: Cardiovascular Disease

## 2013-12-12 ENCOUNTER — Ambulatory Visit (INDEPENDENT_AMBULATORY_CARE_PROVIDER_SITE_OTHER): Payer: Medicare Other | Admitting: Cardiovascular Disease

## 2013-12-12 ENCOUNTER — Encounter: Payer: Self-pay | Admitting: Cardiovascular Disease

## 2013-12-12 VITALS — BP 128/76 | HR 89 | Ht 67.0 in | Wt 130.5 lb

## 2013-12-12 DIAGNOSIS — I5022 Chronic systolic (congestive) heart failure: Secondary | ICD-10-CM

## 2013-12-12 DIAGNOSIS — I2510 Atherosclerotic heart disease of native coronary artery without angina pectoris: Secondary | ICD-10-CM

## 2013-12-12 MED ORDER — FUROSEMIDE 40 MG PO TABS: 40.0000 mg | ORAL_TABLET | Freq: Every day | ORAL | Status: DC

## 2013-12-12 NOTE — Patient Instructions (Signed)
Labs today.   Continue same medication.   Follow up in 3 months.

## 2013-12-12 NOTE — Progress Notes (Signed)
Primary care physician: Dr. Alphonsus SiasLetvak  HPI  This is a 73 year old female who is here today for a followup visit regarding CAD and chronic systolic heart failure. She has known history of coronary artery disease with prior stenting of her diagonal LAD and posterolateral branch. Cardiac catheterization in 12/12 showed patent stents .  Echocardiogram 9/12 demonstrated normal left ventricular function . Myoview 4/14  showed evidence of prior anterior MI with ejection fraction of 39%. The patient suffers from severe dementia. She was hospitalized in July, 2014 for hyponatremia . She had another hospitalization in August for a small non-ST elevation myocardial infarction. She was treated medically as per her choice and family decision given her advanced dementia.   Echocardiogram in 06/2013 showed an ejection fraction of 25-30% with moderate to severe mitral regurgitation. She was treated with Tolvaptan for hyponatermia . Hydrochlorothiazide was discontinued.  She does have chronic kidney disease as well. The husband called our office 2 weeks ago due to worsening edema and shortness of breath. The dose of Lasix was increased to 40 mg once daily. They reported improved symptoms. She continues to have poor appetite and does not like drinking "boost".    Allergies  Allergen Reactions  . Ciprofloxacin Rash     Current Outpatient Prescriptions on File Prior to Visit  Medication Sig Dispense Refill  . aspirin 81 MG tablet Take 81 mg by mouth daily.      Marland Kitchen. atorvastatin (LIPITOR) 40 MG tablet Take 40 mg by mouth daily.      . citalopram (CELEXA) 20 MG tablet Take 1 tablet (20 mg total) by mouth daily.  30 tablet  11  . donepezil (ARICEPT) 5 MG tablet Take 5 mg by mouth at bedtime.      . ferrous sulfate 325 (65 FE) MG tablet Take 1 tablet (325 mg total) by mouth daily with breakfast.  30 tablet  3  . furosemide (LASIX) 20 MG tablet Take 2 tablets (40 mg total) by mouth daily.  60 tablet  3  . glucose blood  (ONE TOUCH TEST STRIPS) test strip Check blood sugar twice a week and as directed. Dx 250.60  100 each  1  . lisinopril (PRINIVIL,ZESTRIL) 10 MG tablet Take 1 tablet (10 mg total) by mouth daily.  90 tablet  3  . memantine (NAMENDA) 10 MG tablet Take 10 mg by mouth 2 (two) times daily.      . metFORMIN (GLUCOPHAGE) 1000 MG tablet Take 0.5 tablets (500 mg total) by mouth 2 (two) times daily with a meal.  90 tablet  3  . metoprolol succinate (TOPROL-XL) 25 MG 24 hr tablet TAKE 1 TABLET (25 MG TOTAL) BY MOUTH DAILY. TAKE WITH OR IMMEDIATELY FOLLOWING A MEAL.  30 tablet  6  . mirtazapine (REMERON) 15 MG tablet Take 1 tablet (15 mg total) by mouth at bedtime.  30 tablet  5  . nitroGLYCERIN (NITROSTAT) 0.4 MG SL tablet Place 1 tablet (0.4 mg total) under the tongue every 5 (five) minutes as needed for chest pain.  90 tablet  3  . spironolactone (ALDACTONE) 25 MG tablet Take 0.5 tablets (12.5 mg total) by mouth daily.  90 tablet  3   No current facility-administered medications on file prior to visit.     Past Medical History  Diagnosis Date  . Hypercholesteremia   . CAD (coronary artery disease)prior stents LAD. Cx Diagonal 2003   . Occlusion and stenosis of carotid artery with cerebral infarction     <15% R <  50% L 2012  . Hypertension   . Mitral regurgitation     Mod-severe  . Dementia   . Type II or unspecified type diabetes mellitus with neurological manifestations, not stated as uncontrolled   . Sleep disturbance, unspecified   . Anxiety   . Osteoarthritis, shoulder   . Chronic kidney disease   . MI (myocardial infarction)     x3  . Asthma     hx  . PVC (premature ventricular contraction)   . Chronic systolic heart failure 05/2013    EF 25-30% with moderate to severe mitral regurgitation     Past Surgical History  Procedure Laterality Date  . Tonsillectomy    . Cardiac catheterization  2003  . Cardiac catheterization  2011  . Cardiac catheterization  2012     Family  History  Problem Relation Age of Onset  . Other Mother     Diabetes Mellitus  . Cancer Father   . Coronary artery disease Other   . Alzheimer's disease Sister   . Heart disease Brother   . Cancer Brother     brain tumor  . Hypertension Maternal Grandmother      History   Social History  . Marital Status: Married    Spouse Name: N/A    Number of Children: 3  . Years of Education: N/A   Occupational History  . Not on file.   Social History Main Topics  . Smoking status: Never Smoker   . Smokeless tobacco: Never Used  . Alcohol Use: No     Comment: Wine occcassionally  . Drug Use: No  . Sexual Activity: Not on file   Other Topics Concern  . Not on file   Social History Narrative   Has living will   Daughter Victorino Dike is health care POA   Has DNR order   No tube feeds if cognitively unaware      PHYSICAL EXAM   BP 128/76  Pulse 89  Ht 5\' 7"  (1.702 m)  Wt 130 lb 8 oz (59.194 kg)  BMI 20.43 kg/m2  Constitutional: She is not oriented to time. She appears well-developed and well-nourished. No distress.  HENT: No nasal discharge.  Head: Normocephalic and atraumatic.  Eyes: Pupils are equal and round. Right eye exhibits no discharge. Left eye exhibits no discharge.  Neck: Normal range of motion. Neck supple. No JVD present. No thyromegaly present.  Cardiovascular: Normal rate, regular rhythm with premature beats, normal heart sounds. Exam reveals no gallop and no friction rub. No murmur heard.  Pulmonary/Chest: Effort normal and breath sounds normal. No stridor. No respiratory distress. She has no wheezes. She has few crackles at the right base. She exhibits no tenderness.  Abdominal: Soft. Bowel sounds are normal. She exhibits no distension. There is no tenderness. There is no rebound and no guarding.  Musculoskeletal: Normal range of motion. She exhibits +1 edema and no tenderness.  Neurological: She is alert and oriented to person, place, and time. Coordination  normal.  Skin: Skin is warm and dry. No rash noted. She is not diaphoretic. No erythema. No pallor.  Psychiatric: Poor insight.     ASSESSMENT AND PLAN

## 2013-12-12 NOTE — Assessment & Plan Note (Signed)
She is feeling better after the recent increase in dose of Lasix to 40 mg once daily. She seems to be mildly fluid overloaded but I am hesitant to increase the dose any further especially with a poor appetite and underlying anemia. Check basic metabolic profile today. I do think that anemia is contributing to her current symptoms and increased myocardial stress.   Lab Results  Component Value Date   WBC 8.6 11/21/2013   HGB 8.8 Repeated and verified X2.* 11/21/2013   HCT 29.0* 11/21/2013   MCV 78.2 11/21/2013   PLT 237.0 11/21/2013

## 2013-12-12 NOTE — Assessment & Plan Note (Signed)
She reports no anginal symptoms. Continue medical therapy. She is a very poor historian due to dementia.

## 2013-12-15 ENCOUNTER — Telehealth: Payer: Self-pay | Admitting: Internal Medicine

## 2013-12-15 NOTE — Telephone Encounter (Signed)
Pt called stating that she received a letter [facility] to schedule a follow up mamm[facility]. Her MM 6 months ago came back abnormal. Please advise.

## 2013-12-15 NOTE — Telephone Encounter (Signed)
Please try to get those records for my review Can set [facility] with [facility] if they did want this

## 2013-12-20 ENCOUNTER — Telehealth: Payer: Self-pay | Admitting: Internal Medicine

## 2013-12-20 DIAGNOSIS — R928 Other abnormal and inconclusive findings on diagnostic imaging of breast: Secondary | ICD-10-CM

## 2013-12-20 NOTE — Telephone Encounter (Signed)
Pt's daughter is calling pt is needing referral put in for Stage 3 Mammo??? Pt would not go into anymore detail than that. Dr. Veronia BeetsJoe Robertson was the name on the letter she received, that is all she would give me she wanted to speak with referral coordinator or nurse. Please advise.

## 2013-12-20 NOTE — Telephone Encounter (Signed)
Spoke to patient and her daughter and was advised that this is a 6 month follow-up mammogram because of something abnormal on the results last time. Patient's daughter stated that she called and tried to schedule the follow-up mammogram [facility] and was advised that Dr. Alphonsus SiasLetvak would have to order it because it is a stage 3 mammogram.

## 2013-12-20 NOTE — Telephone Encounter (Signed)
Tried to call patient back at number listed and voicemail has not yet been set up. Will have to try again later.

## 2013-12-21 NOTE — Telephone Encounter (Signed)
Order placed as best I can without any prior records

## 2013-12-22 NOTE — Telephone Encounter (Signed)
Called [facility] to obtain previous MMG report and they said we had to fax a release form to med records to get it. I faxed request over to them to obtain. Patient is scheduled for 12/29/13 for Breast US as that is what they said she needed to have done. Will await the MMG results so you can order and fax to them.

## 2013-12-28 ENCOUNTER — Ambulatory Visit (INDEPENDENT_AMBULATORY_CARE_PROVIDER_SITE_OTHER): Payer: Medicare Other | Admitting: Internal Medicine

## 2013-12-28 ENCOUNTER — Encounter: Payer: Self-pay | Admitting: Gastroenterology

## 2013-12-28 ENCOUNTER — Encounter: Payer: Self-pay | Admitting: Internal Medicine

## 2013-12-28 VITALS — BP 110/60 | HR 108 | Temp 97.7°F | Wt 132.0 lb

## 2013-12-28 DIAGNOSIS — D509 Iron deficiency anemia, unspecified: Secondary | ICD-10-CM

## 2013-12-28 DIAGNOSIS — I2510 Atherosclerotic heart disease of native coronary artery without angina pectoris: Secondary | ICD-10-CM

## 2013-12-28 LAB — CBC WITH DIFFERENTIAL/PLATELET
BASOS ABS: 0 10*3/uL (ref 0.0–0.1)
BASOS PCT: 0 % (ref 0.0–3.0)
Eosinophils Absolute: 0.3 10*3/uL (ref 0.0–0.7)
Eosinophils Relative: 4.4 % (ref 0.0–5.0)
HCT: 30.6 % — ABNORMAL LOW (ref 36.0–46.0)
HEMOGLOBIN: 9.6 g/dL — AB (ref 12.0–15.0)
LYMPHS PCT: 13.9 % (ref 12.0–46.0)
Lymphs Abs: 0.9 10*3/uL (ref 0.7–4.0)
MCHC: 31.2 g/dL (ref 30.0–36.0)
MCV: 77.5 fl — ABNORMAL LOW (ref 78.0–100.0)
Monocytes Absolute: 0.4 10*3/uL (ref 0.1–1.0)
Monocytes Relative: 5.6 % (ref 3.0–12.0)
NEUTROS PCT: 76.1 % (ref 43.0–77.0)
Neutro Abs: 4.9 10*3/uL (ref 1.4–7.7)
Platelets: 218 10*3/uL (ref 150.0–400.0)
RBC: 3.95 Mil/uL (ref 3.87–5.11)
RDW: 21.8 % — AB (ref 11.5–14.6)
WBC: 6.5 10*3/uL (ref 4.5–10.5)

## 2013-12-28 LAB — IBC PANEL
IRON: 104 ug/dL (ref 42–145)
SATURATION RATIOS: 22.3 % (ref 20.0–50.0)
Transferrin: 332.7 mg/dL (ref 212.0–360.0)

## 2013-12-28 LAB — FERRITIN: Ferritin: 22.1 ng/mL (ref 10.0–291.0)

## 2013-12-28 NOTE — Assessment & Plan Note (Signed)
Clinically better since on iron Will recheck levels Discussed GI evaluation---they were reluctant but her overall status would not preclude even surgery if needed for colonic lesion so I think it is appropriate to proceed

## 2013-12-28 NOTE — Progress Notes (Signed)
Pre visit review using our clinic review tool, if applicable. No additional management support is needed unless otherwise documented below in the visit note. 

## 2013-12-28 NOTE — Progress Notes (Signed)
   Subjective:    Patient ID: [name] Short, female    DOB: [birth year], 73 y.o.   MRN: [id number]  HPI Here with husband and aide Has been taking iron Feels okay--aide notes improvement in appearance and stamina  Still no bleeding in stool or urine No black or dark stools No stomach upset, etc  Doesn't remember if she had a colonoscopy---certainly not recently Bowels still fine despite the iron   Review of Systems Appetite is variable-- often not so great Weight up slightly    Objective:   Physical Exam  Constitutional: She appears well-developed. No distress.  Psychiatric: She has a normal mood and affect. Her behavior is normal.          Assessment & Plan:

## 2013-12-29 ENCOUNTER — Other Ambulatory Visit: Payer: Self-pay | Admitting: Family Medicine

## 2013-12-29 ENCOUNTER — Ambulatory Visit: Payer: Self-pay | Admitting: Family Medicine

## 2013-12-29 DIAGNOSIS — R928 Other abnormal and inconclusive findings on diagnostic imaging of breast: Secondary | ICD-10-CM

## 2013-12-30 LAB — BASIC METABOLIC PANEL
BUN/Creatinine Ratio: 18 (ref 11–26)
BUN: 27 mg/dL (ref 8–27)
CALCIUM: 9.3 mg/dL (ref 8.7–10.3)
CHLORIDE: 100 mmol/L (ref 97–108)
CO2: 23 mmol/L (ref 18–29)
CREATININE: 1.53 mg/dL — AB (ref 0.57–1.00)
GFR calc Af Amer: 39 mL/min/{1.73_m2} — ABNORMAL LOW (ref >59)
GFR calc non Af Amer: 34 mL/min/{1.73_m2} — ABNORMAL LOW (ref >59)
Glucose: 115 mg/dL — ABNORMAL HIGH (ref 65–99)
POTASSIUM: 4.1 mmol/L (ref 3.5–5.2)
SODIUM: 140 mmol/L (ref 134–144)

## 2014-01-02 ENCOUNTER — Encounter: Payer: Self-pay | Admitting: Family Medicine

## 2014-01-03 ENCOUNTER — Ambulatory Visit (INDEPENDENT_AMBULATORY_CARE_PROVIDER_SITE_OTHER): Payer: Medicare Other | Admitting: Family Medicine

## 2014-01-03 ENCOUNTER — Encounter: Payer: Self-pay | Admitting: Family Medicine

## 2014-01-03 ENCOUNTER — Telehealth: Payer: Self-pay | Admitting: Internal Medicine

## 2014-01-03 VITALS — BP 96/64 | HR 91 | Wt 130.5 lb

## 2014-01-03 DIAGNOSIS — R079 Chest pain, unspecified: Secondary | ICD-10-CM

## 2014-01-03 DIAGNOSIS — R0602 Shortness of breath: Secondary | ICD-10-CM

## 2014-01-03 DIAGNOSIS — I2510 Atherosclerotic heart disease of native coronary artery without angina pectoris: Secondary | ICD-10-CM

## 2014-01-03 DIAGNOSIS — I5022 Chronic systolic (congestive) heart failure: Secondary | ICD-10-CM

## 2014-01-03 LAB — CBC WITH DIFFERENTIAL/PLATELET
BASOS ABS: 0.1 10*3/uL (ref 0.0–0.1)
Basophils Relative: 1 % (ref 0–1)
Eosinophils Absolute: 0.1 10*3/uL (ref 0.0–0.7)
Eosinophils Relative: 1 % (ref 0–5)
HEMATOCRIT: 32 % — AB (ref 36.0–46.0)
HEMOGLOBIN: 10 g/dL — AB (ref 12.0–15.0)
LYMPHS PCT: 9 % — AB (ref 12–46)
Lymphs Abs: 0.7 10*3/uL (ref 0.7–4.0)
MCH: 23.5 pg — ABNORMAL LOW (ref 26.0–34.0)
MCHC: 31.3 g/dL (ref 30.0–36.0)
MCV: 75.3 fL — ABNORMAL LOW (ref 78.0–100.0)
MONO ABS: 0.5 10*3/uL (ref 0.1–1.0)
Monocytes Relative: 6 % (ref 3–12)
Neutro Abs: 6.5 10*3/uL (ref 1.7–7.7)
Neutrophils Relative %: 83 % — ABNORMAL HIGH (ref 43–77)
Platelets: 266 10*3/uL (ref 150–400)
RBC: 4.25 MIL/uL (ref 3.87–5.11)
RDW: 19.9 % — ABNORMAL HIGH (ref 11.5–15.5)
WBC: 7.8 10*3/uL (ref 4.0–10.5)

## 2014-01-03 LAB — COMPREHENSIVE METABOLIC PANEL
ALK PHOS: 86 U/L (ref 39–117)
ALT: 20 U/L (ref 0–35)
AST: 14 U/L (ref 0–37)
Albumin: 3.8 g/dL (ref 3.5–5.2)
BILIRUBIN TOTAL: 1.1 mg/dL (ref 0.2–1.2)
BUN: 52 mg/dL — AB (ref 6–23)
CO2: 18 mEq/L — ABNORMAL LOW (ref 19–32)
CREATININE: 1.72 mg/dL — AB (ref 0.50–1.10)
Calcium: 9.4 mg/dL (ref 8.4–10.5)
Chloride: 104 mEq/L (ref 96–112)
Glucose, Bld: 106 mg/dL — ABNORMAL HIGH (ref 70–99)
POTASSIUM: 4.8 meq/L (ref 3.5–5.3)
Sodium: 139 mEq/L (ref 135–145)
Total Protein: 6.6 g/dL (ref 6.0–8.3)

## 2014-01-03 LAB — BRAIN NATRIURETIC PEPTIDE: Brain Natriuretic Peptide: 3223.9 pg/mL — ABNORMAL HIGH (ref 0.0–100.0)

## 2014-01-03 LAB — TROPONIN I: Troponin I: 0.06 ng/mL — ABNORMAL HIGH (ref <0.06)

## 2014-01-03 NOTE — Telephone Encounter (Signed)
Delaney Meigsamara with CAN is speaking with pts husband; pt had SOB ? How long but worse lately, at night pt can only do one word answers due to SOB; Pt is lethargic today with new edema in both feet; pt had CP last night with relief after taking ntg. Pt had bloody nose on and off for 2-3 days. Dr Para Marchuncan will see pt for eval today at 3:45 pm but depending on pts condition and needs pt may have to go to ED. Delaney Meigsamara voiced understanding.

## 2014-01-03 NOTE — Assessment & Plan Note (Signed)
With possible jaundice and NTG responsive CP yesterday.  No CP now.  Pt unable to consent.  D/w husband.  Would check labs today at OV.  If CP not responding to NTG, then to ER.  Hold ACE for now.  Continue other meds for now.  Will notify PCP.   With her current mental status and known CAD, she may be a candidate for palliative care; will defer to PCP.  Addendum- labs noted.  Troponin elevation may be from strain with BNP elevation. Apparently per chart with another episode of CP and advised to go to ER.  Will await f/u notes from ER.

## 2014-01-03 NOTE — Telephone Encounter (Signed)
Routed to PCP as FYI.

## 2014-01-03 NOTE — Progress Notes (Signed)
Pre visit review using our clinic review tool, if applicable. No additional management support is needed unless otherwise documented below in the visit note.  Phone note reviewed, along with prev notes.  Known CAD w/o target for stent, rec medical mgmt previously.  Known dementia with anemia.  Last night with NTG responsive CP.  Recently more SOB.  Per husband, more quiet and sleeping, "just not herself." no blood in stool. Did have a nose bleed. No CP at time of OV.   On baseline meds at home.   PMH and SH reviewed  ROS: See HPI, otherwise noncontributory.  Meds, vitals, and allergies reviewed.   Not oriented but in NAD Appears slightly jaundiced.  Mmm ctab rrr with occ ectopy abd soft, not ttp Ext with 1+ BLE edema  EKG reviewed. Ectopy noted, but no apparent acute changes.  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

## 2014-01-03 NOTE — Telephone Encounter (Signed)
Phone call from labs --->  BNP elevated (not a new finding) , troponin +. Was seen earlier today w/ CP. Called pt's house , spoke w/ the aid and advise ER given recent CP and slt + trop. Aid states will d/w pt's daughter

## 2014-01-03 NOTE — Telephone Encounter (Signed)
Patient Information:  Caller Name: [name]  Phone: [phone]  Patient: [name] Short, [name] Short  Gender: Female  DOB: 1941/06/09  Age: 73 Years  PCP: Kerby NoraBedsole, [name] [facility](Family Practice)  Office Follow Up:  Does the office need to follow up with this patient?: No  Instructions For The Office: N/A  RN Note:  Afebrile. Last night (01/02/2014) SOB, which is not new lately with new chest pain last night, that responded to one Nitroglycerin husband, [name] Mcalpinehil gave. [name] Mcalpinehil denies any new chest pain today or SOB but states the SOB will come back tonight, edema bilaterally increasing (does not weigh her and not sure if she is taking meds, states she tries) losing her hair, lethargic, won't do anything and "not the same person". In the last 2-3 days increased bloody nose, pedal edema bilaterally increased and SOB increases expecially at night (increased pillows don't help) and husband states she can only speak one word at time, needs to sit on the side of the bed to breathe. RN/CAN called office and spoke to the nurse and explained the cardiac s/s and that he did not want to use the cardiologist and wanted to try this office. Nurse, [name] put on hold for a while, two doctors rearranged schedules and agreed to have [name] Short seen today, 01/03/2014 @ 3:45 with Dr. Jaynee Eaglesunkin, husband agreed and aware he may be sent to the ER if necessary after the appoitment.   Symptoms  Reason For Call & Symptoms: SOB and chest pain  Reviewed Health History In EMR: Yes  Reviewed Medications In EMR: Yes  Reviewed Allergies In EMR: Yes  Reviewed Surgeries / Procedures: Yes  Date of Onset of Symptoms: 01/03/2014  Treatments Tried: one Nitroglycerin  Treatments Tried Worked: Yes  Guideline(s) Used:  Breathing Difficulty  Disposition Per Guideline:   Go to Office Now  Reason For Disposition Reached:   Mild difficulty breathing (e.g., minimal/no SOB at rest, SOB with walking, pulse < 100) of new onset or worse than normal  Advice Given:  N/A  Patient Will Follow Care Advice:  YES

## 2014-01-03 NOTE — Telephone Encounter (Signed)
Pts care giver called back to see what room pt is going to; advised pt is to come to [facility] at 3:45 today for eval and depending on pt condition may need to be sent to ED. If pts condition worsens prior to appt time pt to go to ED. Care giver voiced understanding.

## 2014-01-03 NOTE — Patient Instructions (Addendum)
We'll contact you with your lab report. Stop the lisinopril for now.  Use nitroglycerin if you have chest pain.  If the chest pain doesn't resolve, then dial 911 or go to the ER.

## 2014-01-04 ENCOUNTER — Telehealth: Payer: Self-pay | Admitting: *Deleted

## 2014-01-04 NOTE — Telephone Encounter (Signed)
I spoke with patient's caregiver and she'll bring patient tomorrow at 4:00.

## 2014-01-04 NOTE — Telephone Encounter (Signed)
Called patient and husband with results per Dr. Para Marchuncan. After scheduling appointment for Friday realized that patient has an appointment scheduled for Thursday. Was advised by patient's husband that they will keep the appointment for Thursday.

## 2014-01-04 NOTE — Telephone Encounter (Signed)
Spoke to caregiver Didn't go to ER  Just told her she didn't feel right Will set up appt tomorrow or Friday to recheck  [facility],  Please call them to set this up

## 2014-01-05 ENCOUNTER — Ambulatory Visit (INDEPENDENT_AMBULATORY_CARE_PROVIDER_SITE_OTHER): Payer: Medicare Other | Admitting: Internal Medicine

## 2014-01-05 ENCOUNTER — Encounter: Payer: Self-pay | Admitting: Internal Medicine

## 2014-01-05 VITALS — BP 100/60 | HR 104 | Temp 97.6°F | Wt 130.0 lb

## 2014-01-05 DIAGNOSIS — F0280 Dementia in other diseases classified elsewhere without behavioral disturbance: Secondary | ICD-10-CM

## 2014-01-05 DIAGNOSIS — I2510 Atherosclerotic heart disease of native coronary artery without angina pectoris: Secondary | ICD-10-CM

## 2014-01-05 DIAGNOSIS — N184 Chronic kidney disease, stage 4 (severe): Secondary | ICD-10-CM

## 2014-01-05 DIAGNOSIS — I5022 Chronic systolic (congestive) heart failure: Secondary | ICD-10-CM

## 2014-01-05 NOTE — Assessment & Plan Note (Signed)
Seems to have had some typical angina Worsening functional status but fluid is neutral ?NYHA class 3b?  Discussed and offered hospice referral  Husband is scared and confused by that---some initial conversations

## 2014-01-05 NOTE — Assessment & Plan Note (Signed)
Moderate cognitive problems No sig change

## 2014-01-05 NOTE — Progress Notes (Signed)
Pre visit review using our clinic review tool, if applicable. No additional management support is needed unless otherwise documented below in the visit note. 

## 2014-01-05 NOTE — Progress Notes (Signed)
Subjective:    Patient ID: [name] RangeSarah Short, female    DOB: [birth year], 73 y.o.   MRN: [id number]  HPI Here with husband Had trouble with chest pain Doesn't remember Husband does give her NTG---perhaps 1 twice a week  Ongoing anemia Looks like renal function now CKD IV  Has not been able to help out in the house Gets DOE even walking short distances Even seems to have dyspnea walking in the house  Some edema at times Sleeps fairly flat but awakens frequently SOB  Has aides 6AM to 10PM Has county nurse (??) monthly also  Current Outpatient Prescriptions on File Prior to Visit  Medication Sig Dispense Refill  . aspirin 81 MG tablet Take 81 mg by mouth daily.      Marland Kitchen. atorvastatin (LIPITOR) 40 MG tablet Take 40 mg by mouth daily.      . citalopram (CELEXA) 20 MG tablet Take 1 tablet (20 mg total) by mouth daily.  30 tablet  11  . donepezil (ARICEPT) 5 MG tablet Take 5 mg by mouth at bedtime.      . ferrous sulfate 325 (65 FE) MG tablet Take 1 tablet (325 mg total) by mouth daily with breakfast.  30 tablet  3  . furosemide (LASIX) 40 MG tablet Take 1 tablet (40 mg total) by mouth daily.  30 tablet  6  . glucose blood (ONE TOUCH TEST STRIPS) test strip Check blood sugar twice a week and as directed. Dx 250.60  100 each  1  . memantine (NAMENDA) 10 MG tablet Take 10 mg by mouth 2 (two) times daily.      . metFORMIN (GLUCOPHAGE) 1000 MG tablet Take 0.5 tablets (500 mg total) by mouth 2 (two) times daily with a meal.  90 tablet  3  . metoprolol succinate (TOPROL-XL) 25 MG 24 hr tablet TAKE 1 TABLET (25 MG TOTAL) BY MOUTH DAILY. TAKE WITH OR IMMEDIATELY FOLLOWING A MEAL.  30 tablet  6  . mirtazapine (REMERON) 15 MG tablet Take 1 tablet (15 mg total) by mouth at bedtime.  30 tablet  5  . nitroGLYCERIN (NITROSTAT) 0.4 MG SL tablet Place 1 tablet (0.4 mg total) under the tongue every 5 (five) minutes as needed for chest pain.  90 tablet  3  . spironolactone (ALDACTONE) 25 MG tablet Take 0.5  tablets (12.5 mg total) by mouth daily.  90 tablet  3   No current facility-administered medications on file prior to visit.    Allergies  Allergen Reactions  . Ciprofloxacin Rash    Past Medical History  Diagnosis Date  . Hypercholesteremia   . CAD (coronary artery disease)prior stents LAD. Cx Diagonal 2003   . Occlusion and stenosis of carotid artery with cerebral infarction     <15% R <50% L 2012  . Hypertension   . Mitral regurgitation     Mod-severe  . Dementia   . Type II or unspecified type diabetes mellitus with neurological manifestations, not stated as uncontrolled   . Sleep disturbance, unspecified   . Anxiety   . Osteoarthritis, shoulder   . Chronic kidney disease   . MI (myocardial infarction)     x3  . Asthma     hx  . PVC (premature ventricular contraction)   . Chronic systolic heart failure 05/2013    EF 25-30% with moderate to severe mitral regurgitation    Past Surgical History  Procedure Laterality Date  . Tonsillectomy    . Cardiac catheterization  2003  .  Cardiac catheterization  2011  . Cardiac catheterization  2012    Family History  Problem Relation Age of Onset  . Other Mother     Diabetes Mellitus  . Cancer Father   . Coronary artery disease Other   . Alzheimer's disease Sister   . Heart disease Brother   . Cancer Brother     brain tumor  . Hypertension Maternal Grandmother     History   Social History  . Marital Status: Married    Spouse Name: N/A    Number of Children: 3  . Years of Education: N/A   Occupational History  . Not on file.   Social History Main Topics  . Smoking status: Never Smoker   . Smokeless tobacco: Never Used  . Alcohol Use: No     Comment: Wine occcassionally  . Drug Use: No  . Sexual Activity: Not on file   Other Topics Concern  . Not on file   Social History Narrative   Has living will   Daughter Victorino Dike is health care POA   Has DNR order   No tube feeds if cognitively unaware     Review of Systems Sleeps okay----much of the time Not eating great but weight is stable    Objective:   Physical Exam  Constitutional: She appears well-developed and well-nourished. No distress.  Neck: Normal Short of motion. No thyromegaly present.  Cardiovascular: Normal rate and regular rhythm.  Exam reveals no gallop.   No murmur heard. occ skips  Pulmonary/Chest: Effort normal and breath sounds normal. No respiratory distress. She has no wheezes. She has no rales.  Musculoskeletal:  Left calf full without pitting Right calf okay  Lymphadenopathy:    She has no cervical adenopathy.  Neurological:  Cooperative but doesn't remember anything to answer questions          Assessment & Plan:

## 2014-01-05 NOTE — Assessment & Plan Note (Signed)
I think she may have fairly typical angina but just can't elucidate it He has the nitro to use

## 2014-01-05 NOTE — Patient Instructions (Signed)
Please bring your family in with you for the next visit to discuss goals of care and how much we want to do.

## 2014-01-05 NOTE — Assessment & Plan Note (Signed)
With secondary anemia

## 2014-01-06 ENCOUNTER — Telehealth: Payer: Self-pay

## 2014-01-06 ENCOUNTER — Ambulatory Visit: Payer: Medicare Other | Admitting: Internal Medicine

## 2014-01-06 NOTE — Telephone Encounter (Signed)
[name] DikeJennifer request cb from Dr Alphonsus SiasLetvak [name] Short([name] Short contact # [phone]) about pts office visit on 01/05/14; [name] DikeJennifer said [name] Short told her that pt needs hospice care and 24 hour round the clock nursing care; [name] DikeJennifer also wants to know if any changes to meds.

## 2014-01-06 NOTE — Telephone Encounter (Signed)
[facility] the [facility] notes and [facility]

## 2014-01-06 NOTE — Telephone Encounter (Signed)
Discussed my concerns about her instability Daughter shares the concerns Clearly worse lately Not eating,etc  She understands the purpose of hospice care and feels she needs this now (and is medical POA) Referral made to [facility] [place]--Denise  Lyla SonCarrie, Please fax our demographics as well as my last 2 notes and Dr Jari SportsmanArida's last note to [facility] at [phone]

## 2014-01-09 NOTE — Telephone Encounter (Signed)
Surprising but will just deal with that

## 2014-01-09 NOTE — Telephone Encounter (Signed)
Denise with [facility] said admissions had evaluated pt today and pt is not found to be hospice appropriate at this time.

## 2014-01-10 ENCOUNTER — Other Ambulatory Visit: Payer: Self-pay

## 2014-01-10 MED ORDER — ATORVASTATIN CALCIUM 40 MG PO TABS: 40.0000 mg | ORAL_TABLET | Freq: Every day | ORAL | Status: DC

## 2014-01-10 NOTE — Telephone Encounter (Signed)
Mika,caregiver for pt said pts FBS averages 120 and wants to know if she should ck her BS more often than once a day. Also request refill atorvastatin; previously prescribed by another doctor that pt no longer sees; Dr Alphonsus SiasLetvak has never prescribed.Please advise.

## 2014-01-10 NOTE — Telephone Encounter (Signed)
Once a day for checking sugar is fine Okay to fill for 1 year

## 2014-01-10 NOTE — Telephone Encounter (Signed)
rx sent to pharmacy by e-script Spoke with caregiver and advised results

## 2014-01-19 ENCOUNTER — Ambulatory Visit (INDEPENDENT_AMBULATORY_CARE_PROVIDER_SITE_OTHER): Payer: Medicare Other | Admitting: Internal Medicine

## 2014-01-19 ENCOUNTER — Ambulatory Visit: Payer: Medicare Other | Admitting: Internal Medicine

## 2014-01-19 ENCOUNTER — Encounter: Payer: Self-pay | Admitting: Internal Medicine

## 2014-01-19 VITALS — BP 100/60 | HR 70 | Temp 98.0°F | Wt 132.0 lb

## 2014-01-19 DIAGNOSIS — I2510 Atherosclerotic heart disease of native coronary artery without angina pectoris: Secondary | ICD-10-CM

## 2014-01-19 DIAGNOSIS — E1149 Type 2 diabetes mellitus with other diabetic neurological complication: Secondary | ICD-10-CM

## 2014-01-19 DIAGNOSIS — R634 Abnormal weight loss: Secondary | ICD-10-CM

## 2014-01-19 NOTE — Patient Instructions (Signed)
Stop donepezil (aricept), iron (ferrous sulfate), metformin and  Atorvastatin.

## 2014-01-19 NOTE — Progress Notes (Signed)
Subjective:    Patient ID: [name] RangeSarah Short, female    DOB: May 06, 1941, 73 y.o.   MRN: [id number]  HPI Here with husband and aide  Not eating and drinking Will take boost-- 1-2 cans per day No appetite for any food--- doesn't like the taste of anything No more than a couple of bites of anything If she refuses some pills--she may eat better  No nausea or vomiting  Sugars never over 127  Current Outpatient Prescriptions on File Prior to Visit  Medication Sig Dispense Refill  . aspirin 81 MG tablet Take 81 mg by mouth daily.      Marland Kitchen. atorvastatin (LIPITOR) 40 MG tablet Take 1 tablet (40 mg total) by mouth daily.  90 tablet  3  . citalopram (CELEXA) 20 MG tablet Take 1 tablet (20 mg total) by mouth daily.  30 tablet  11  . donepezil (ARICEPT) 5 MG tablet Take 5 mg by mouth at bedtime.      . ferrous sulfate 325 (65 FE) MG tablet Take 1 tablet (325 mg total) by mouth daily with breakfast.  30 tablet  3  . furosemide (LASIX) 40 MG tablet Take 1 tablet (40 mg total) by mouth daily.  30 tablet  6  . glucose blood (ONE TOUCH TEST STRIPS) test strip Check blood sugar twice a week and as directed. Dx 250.60  100 each  1  . memantine (NAMENDA) 10 MG tablet Take 10 mg by mouth 2 (two) times daily.      . metFORMIN (GLUCOPHAGE) 1000 MG tablet Take 0.5 tablets (500 mg total) by mouth 2 (two) times daily with a meal.  90 tablet  3  . metoprolol succinate (TOPROL-XL) 25 MG 24 hr tablet TAKE 1 TABLET (25 MG TOTAL) BY MOUTH DAILY. TAKE WITH OR IMMEDIATELY FOLLOWING A MEAL.  30 tablet  6  . mirtazapine (REMERON) 15 MG tablet Take 1 tablet (15 mg total) by mouth at bedtime.  30 tablet  5  . nitroGLYCERIN (NITROSTAT) 0.4 MG SL tablet Place 1 tablet (0.4 mg total) under the tongue every 5 (five) minutes as needed for chest pain.  90 tablet  3  . spironolactone (ALDACTONE) 25 MG tablet Take 0.5 tablets (12.5 mg total) by mouth daily.  90 tablet  3   No current facility-administered medications on file prior  to visit.    Allergies  Allergen Reactions  . Ciprofloxacin Rash    Past Medical History  Diagnosis Date  . Hypercholesteremia   . CAD (coronary artery disease)prior stents LAD. Cx Diagonal 2003   . Occlusion and stenosis of carotid artery with cerebral infarction     <15% R <50% L 2012  . Hypertension   . Mitral regurgitation     Mod-severe  . Dementia   . Type II or unspecified type diabetes mellitus with neurological manifestations, not stated as uncontrolled   . Sleep disturbance, unspecified   . Anxiety   . Osteoarthritis, shoulder   . Chronic kidney disease   . MI (myocardial infarction)     x3  . Asthma     hx  . PVC (premature ventricular contraction)   . Chronic systolic heart failure 05/2013    EF 25-30% with moderate to severe mitral regurgitation    Past Surgical History  Procedure Laterality Date  . Tonsillectomy    . Cardiac catheterization  2003  . Cardiac catheterization  2011  . Cardiac catheterization  2012    Family History  Problem Relation Age  of Onset  . Other Mother     Diabetes Mellitus  . Cancer Father   . Coronary artery disease Other   . Alzheimer's disease Sister   . Heart disease Brother   . Cancer Brother     brain tumor  . Hypertension Maternal Grandmother     History   Social History  . Marital Status: Married    Spouse Name: N/A    Number of Children: 3  . Years of Education: N/A   Occupational History  . Not on file.   Social History Main Topics  . Smoking status: Never Smoker   . Smokeless tobacco: Never Used  . Alcohol Use: No     Comment: Wine occcassionally  . Drug Use: No  . Sexual Activity: Not on file   Other Topics Concern  . Not on file   Social History Narrative   Has living will   Daughter Victorino Dike is health care POA   Has DNR order   No tube feeds if cognitively unaware   Review of Systems Weight is stable Sleeps fair--but does awaken at night Does have some apparent PND    Objective:     Physical Exam  Psychiatric:  Distant Engages a little but not much          Assessment & Plan:

## 2014-01-19 NOTE — Assessment & Plan Note (Addendum)
Hasn't lost weight but won't eat History suggests relationship with some medication Will stop the aricept (not clearly helpful---unlike the namenda which husband feels helped) Stop metformin since sugars well controlled Stop atorvastatin ---but restart at next visit if doing better Stop the iron--iron was normal last month  Will check labs---to be sure thyroid okay, and no change in kidney function

## 2014-01-19 NOTE — Progress Notes (Signed)
Pre visit review using our clinic review tool, if applicable. No additional management support is needed unless otherwise documented below in the visit note. 

## 2014-01-20 LAB — BASIC METABOLIC PANEL
BUN: 72 mg/dL — ABNORMAL HIGH (ref 6–23)
CHLORIDE: 108 meq/L (ref 96–112)
CO2: 24 meq/L (ref 19–32)
CREATININE: 1.9 mg/dL — AB (ref 0.4–1.2)
Calcium: 9.3 mg/dL (ref 8.4–10.5)
GFR: 27.4 mL/min — ABNORMAL LOW (ref >60.00)
Glucose, Bld: 87 mg/dL (ref 70–99)
Potassium: 4.8 mEq/L (ref 3.5–5.1)
Sodium: 141 mEq/L (ref 135–145)

## 2014-01-20 LAB — HEMOGLOBIN A1C: Hgb A1c MFr Bld: 6.2 % (ref 4.6–6.5)

## 2014-01-20 LAB — TSH: TSH: 1.7 u[IU]/mL (ref 0.35–5.50)

## 2014-01-20 LAB — T4, FREE: FREE T4: 0.92 ng/dL (ref 0.60–1.60)

## 2014-01-25 ENCOUNTER — Encounter: Payer: Self-pay | Admitting: *Deleted

## 2014-01-27 ENCOUNTER — Ambulatory Visit (INDEPENDENT_AMBULATORY_CARE_PROVIDER_SITE_OTHER): Payer: Medicare Other | Admitting: Internal Medicine

## 2014-01-27 ENCOUNTER — Encounter: Payer: Self-pay | Admitting: Internal Medicine

## 2014-01-27 ENCOUNTER — Telehealth: Payer: Self-pay | Admitting: Internal Medicine

## 2014-01-27 VITALS — BP 98/60 | HR 88 | Temp 98.0°F | Wt 143.0 lb

## 2014-01-27 DIAGNOSIS — I509 Heart failure, unspecified: Secondary | ICD-10-CM

## 2014-01-27 DIAGNOSIS — R630 Anorexia: Secondary | ICD-10-CM

## 2014-01-27 DIAGNOSIS — R1011 Right upper quadrant pain: Secondary | ICD-10-CM

## 2014-01-27 DIAGNOSIS — R609 Edema, unspecified: Secondary | ICD-10-CM

## 2014-01-27 DIAGNOSIS — F05 Delirium due to known physiological condition: Secondary | ICD-10-CM

## 2014-01-27 DIAGNOSIS — R17 Unspecified jaundice: Secondary | ICD-10-CM

## 2014-01-27 LAB — COMPREHENSIVE METABOLIC PANEL
ALT: 55 U/L — ABNORMAL HIGH (ref 0–35)
AST: 36 U/L (ref 0–37)
Albumin: 3.7 g/dL (ref 3.5–5.2)
Alkaline Phosphatase: 115 U/L (ref 39–117)
BUN: 74 mg/dL — ABNORMAL HIGH (ref 6–23)
CALCIUM: 9.4 mg/dL (ref 8.4–10.5)
CHLORIDE: 100 meq/L (ref 96–112)
CO2: 18 meq/L — AB (ref 19–32)
CREATININE: 1.82 mg/dL — AB (ref 0.50–1.10)
GLUCOSE: 200 mg/dL — AB (ref 70–99)
Potassium: 5.8 mEq/L — ABNORMAL HIGH (ref 3.5–5.3)
Sodium: 134 mEq/L — ABNORMAL LOW (ref 135–145)
TOTAL PROTEIN: 6.7 g/dL (ref 6.0–8.3)
Total Bilirubin: 1 mg/dL (ref 0.2–1.2)

## 2014-01-27 LAB — CBC
HCT: 34.1 % — ABNORMAL LOW (ref 36.0–46.0)
Hemoglobin: 10.4 g/dL — ABNORMAL LOW (ref 12.0–15.0)
MCH: 23.7 pg — ABNORMAL LOW (ref 26.0–34.0)
MCHC: 30.5 g/dL (ref 30.0–36.0)
MCV: 77.9 fL — AB (ref 78.0–100.0)
PLATELETS: 198 10*3/uL (ref 150–400)
RBC: 4.38 MIL/uL (ref 3.87–5.11)
RDW: 20.8 % — ABNORMAL HIGH (ref 11.5–15.5)
WBC: 8.6 10*3/uL (ref 4.0–10.5)

## 2014-01-27 LAB — LIPASE: LIPASE: 73 U/L (ref 0–75)

## 2014-01-27 LAB — AMYLASE: AMYLASE: 41 U/L (ref 0–105)

## 2014-01-27 NOTE — Progress Notes (Signed)
Pre visit review using our clinic review tool, if applicable. No additional management support is needed unless otherwise documented below in the visit note. 

## 2014-01-27 NOTE — Patient Instructions (Addendum)
Jaundice Jaundice is a yellowish discoloration of the skin, whites of the eyes, and mucous membranes. It is caused by increased levels of bilirubin in the blood (hyperbilirubinemia). Bilirubin is produced by the normal breakdown of red blood cells. Jaundice may mean the liver or bile system is not working normally. CAUSES  The most common causes include:  Viral hepatitis.  Gallstones.  Excess use of alcohol.  Liver disease.  Certain cancers. SYMPTOMS   Yellow color to the skin, whites of the eyes, or mucous membranes.  Dark brown colored urine.  Stomach pain.  Light or clay colored stool.  Itchy skin. DIAGNOSIS   Your history will be taken along with a physical exam.  Urine and blood tests.  Abdominal ultrasound.  CT scans.  MRI.  Liver biopsy if the liver disease is suspected.  Endoscopic retrograde cholangiopancreatography (ERCP). TREATMENT  Treatment depends on the cause or related to the treatment of an underlying condition. For example, if jaundice is caused by gallstones, the stones or gallbladder may need to be removed. Other treatments may include:  Rest.  Stopping a certain medicine if it is causing the jaundice.  Giving fluid through the vein (IV fluids).  Surgery (removing gallstones, cancers). Some conditions that cause jaundice can be fatal if not treated. HOME CARE INSTRUCTIONS   Rest.  Drink enough fluids to keep your urine clear or pale yellow.  Avoid all alcoholic drinks.  Only take over-the-counter or prescription medicines for nausea, vomiting, itching, pain, discomfort, or fever as directed by your caregiver.  If jaundice is due to viral hepatitis or an infection:  Avoid close contact with people.  Avoid preparing food for others.  Avoid sharing utensils with others.  Wash your hands often.  Keep all follow-up appointments with your caregiver.  Use skin lotions to relieve itching. SEEK IMMEDIATE MEDICAL CARE IF:   You  have increased pain.  You have repeated vomiting.  You become dehydrated.  You have a fever or persistent symptoms for more than 72 hours.  You have a fever and your symptoms suddenly get worse.  You become weak or confused.  You develop a severe headache. MAKE SURE YOU:   Understand these instructions.  Will watch your condition.  Will get help right away if you are not doing well or get worse. Document Released: 09/29/2005 Document Revised: 12/22/2011 Document Reviewed: 09/13/2010 [employer] Patient Information 2014 [employer]  

## 2014-01-27 NOTE — Progress Notes (Signed)
Subjective:    Patient ID: [name] Short, female    DOB: 1941-10-06, 73 y.o.   MRN: [id number]  HPI  Pt presents to the clinic today with c/o not eating. This has been going on for about a month. She was seen 01/19/14 for the same. She reports she has poor appetite because she can not taste food. She is on Ensure with 2 meals per day. She is also on Remeron for appetite. She has gained 11 pounds since 01/19/14. Her aide that is with her reports that this is all fluid weight. She has been giving her diuretics to her but she reports some times, the patient hides them. The aide reports that something has changed with her pt, she has been slightly confused and her color is off.  Review of Systems      Past Medical History  Diagnosis Date  . Hypercholesteremia   . CAD (coronary artery disease)prior stents LAD. Cx Diagonal 2003   . Occlusion and stenosis of carotid artery with cerebral infarction     <15% R <50% L 2012  . Hypertension   . Mitral regurgitation     Mod-severe  . Dementia   . Type II or unspecified type diabetes mellitus with neurological manifestations, not stated as uncontrolled   . Sleep disturbance, unspecified   . Anxiety   . Osteoarthritis, shoulder   . Chronic kidney disease   . MI (myocardial infarction)     x3  . Asthma     hx  . PVC (premature ventricular contraction)   . Chronic systolic heart failure 05/2013    EF 25-30% with moderate to severe mitral regurgitation    Current Outpatient Prescriptions  Medication Sig Dispense Refill  . aspirin 81 MG tablet Take 81 mg by mouth daily.      . citalopram (CELEXA) 20 MG tablet Take 1 tablet (20 mg total) by mouth daily.  30 tablet  11  . furosemide (LASIX) 40 MG tablet Take 1 tablet (40 mg total) by mouth daily.  30 tablet  6  . glucose blood (ONE TOUCH TEST STRIPS) test strip Check blood sugar twice a week and as directed. Dx 250.60  100 each  1  . memantine (NAMENDA) 10 MG tablet Take 10 mg by mouth 2 (two)  times daily.      . metoprolol succinate (TOPROL-XL) 25 MG 24 hr tablet TAKE 1 TABLET (25 MG TOTAL) BY MOUTH DAILY. TAKE WITH OR IMMEDIATELY FOLLOWING A MEAL.  30 tablet  6  . mirtazapine (REMERON) 15 MG tablet Take 1 tablet (15 mg total) by mouth at bedtime.  30 tablet  5  . nitroGLYCERIN (NITROSTAT) 0.4 MG SL tablet Place 1 tablet (0.4 mg total) under the tongue every 5 (five) minutes as needed for chest pain.  90 tablet  3  . spironolactone (ALDACTONE) 25 MG tablet Take 0.5 tablets (12.5 mg total) by mouth daily.  90 tablet  3   No current facility-administered medications for this visit.    Allergies  Allergen Reactions  . Ciprofloxacin Rash    Family History  Problem Relation Age of Onset  . Other Mother     Diabetes Mellitus  . Cancer Father   . Coronary artery disease Other   . Alzheimer's disease Sister   . Heart disease Brother   . Cancer Brother     brain tumor  . Hypertension Maternal Grandmother     History   Social History  . Marital Status: Married  Spouse Name: N/A    Number of Children: 3  . Years of Education: N/A   Occupational History  . Not on file.   Social History Main Topics  . Smoking status: Never Smoker   . Smokeless tobacco: Never Used  . Alcohol Use: No     Comment: Wine occcassionally  . Drug Use: No  . Sexual Activity: Not on file   Other Topics Concern  . Not on file   Social History Narrative   Has living will   Daughter Victorino DikeJennifer is health care POA   Has DNR order   No tube feeds if cognitively unaware     Constitutional: Pt reports weight loss. Denies fever, malaise, fatigue, headache.  Gastrointestinal: Pt reports poor appetite. Denies abdominal pain, bloating, constipation, diarrhea or blood in the stool.     No other specific complaints in a complete review of systems (except as listed in HPI above).  Objective:   Physical Exam   BP 98/60  Pulse 88  Temp(Src) 98 F (36.7 C) (Tympanic)  Wt 143 lb (64.864 kg)   SpO2 95% Wt Readings from Last 3 Encounters:  01/27/14 143 lb (64.864 kg)  01/19/14 132 lb (59.875 kg)  01/05/14 130 lb (58.968 kg)    General: Appears her stated age, chronically ill appearing in NAD. Skin: jaundice with scleral icterus noted. Cardiovascular: Normal rate and rhythm. S1,S2 noted.  No murmur, rubs or gallops noted. JVD or BLE edema noted, 3+ pitting.  Pulmonary/Chest: Normal effort and bibasilar crackles. No respiratory distress. No wheezes, rales or ronchi noted.  Abdomen: Soft and RUQ abdominal pain. Normal bowel sounds, no bruits noted. No distention or masses noted. Liver, spleen and kidneys non palpable.  BMET    Component Value Date/Time   NA 141 01/19/2014 1740   NA 140 12/12/2013 1647   K 4.8 01/19/2014 1740   CL 108 01/19/2014 1740   CO2 24 01/19/2014 1740   GLUCOSE 87 01/19/2014 1740   GLUCOSE 115* 12/12/2013 1647   BUN 72* 01/19/2014 1740   BUN 27 12/12/2013 1647   CREATININE 1.9* 01/19/2014 1740   CREATININE 1.72* 01/03/2014 1646   CALCIUM 9.3 01/19/2014 1740   GFRNONAA 34* 12/12/2013 1647   GFRAA 39* 12/12/2013 1647    Lipid Panel     Component Value Date/Time   CHOL 146 09/29/2013 0907   TRIG 124.0 09/29/2013 0907   HDL 40.10 09/29/2013 0907   CHOLHDL 4 09/29/2013 0907   VLDL 24.8 09/29/2013 0907   LDLCALC 81 09/29/2013 0907    CBC    Component Value Date/Time   WBC 7.8 01/03/2014 1646   WBC 7.6 11/15/2013 1405   RBC 4.25 01/03/2014 1646   RBC 3.55* 11/15/2013 1405   HGB 10.0* 01/03/2014 1646   HCT 32.0* 01/03/2014 1646   PLT 266 01/03/2014 1646   MCV 75.3* 01/03/2014 1646   MCH 23.5* 01/03/2014 1646   MCH 24.2* 11/15/2013 1405   MCHC 31.3 01/03/2014 1646   MCHC 30.6* 11/15/2013 1405   RDW 19.9* 01/03/2014 1646   RDW 16.8* 11/15/2013 1405   LYMPHSABS 0.7 01/03/2014 1646   LYMPHSABS 1.2 11/15/2013 1405   MONOABS 0.5 01/03/2014 1646   EOSABS 0.1 01/03/2014 1646   EOSABS 0.3 11/15/2013 1405   BASOSABS 0.1 01/03/2014 1646   BASOSABS 0.0 11/15/2013 1405    Hgb A1C Lab Results    Component Value Date   HGBA1C 6.2 01/19/2014        Assessment & Plan:  Loss of appetite, jaundice, edema:  Will check stat labs, cbc, cmet, amylase, lipase Will follow up after labs are back, if any worse before I get in touch with you, go to ER  RTC as needed

## 2014-01-31 ENCOUNTER — Encounter: Payer: Self-pay | Admitting: Internal Medicine

## 2014-01-31 ENCOUNTER — Ambulatory Visit (INDEPENDENT_AMBULATORY_CARE_PROVIDER_SITE_OTHER): Payer: Medicare Other | Admitting: Internal Medicine

## 2014-01-31 ENCOUNTER — Telehealth: Payer: Self-pay | Admitting: Radiology

## 2014-01-31 VITALS — BP 98/60 | HR 63 | Temp 98.4°F | Wt 143.0 lb

## 2014-01-31 DIAGNOSIS — F0280 Dementia in other diseases classified elsewhere without behavioral disturbance: Secondary | ICD-10-CM

## 2014-01-31 DIAGNOSIS — I2510 Atherosclerotic heart disease of native coronary artery without angina pectoris: Secondary | ICD-10-CM

## 2014-01-31 DIAGNOSIS — N184 Chronic kidney disease, stage 4 (severe): Secondary | ICD-10-CM

## 2014-01-31 DIAGNOSIS — I5023 Acute on chronic systolic (congestive) heart failure: Secondary | ICD-10-CM | POA: Insufficient documentation

## 2014-01-31 LAB — BASIC METABOLIC PANEL
BUN: 101 mg/dL — AB (ref 6–23)
CALCIUM: 9.2 mg/dL (ref 8.4–10.5)
CO2: 23 meq/L (ref 19–32)
CREATININE: 2.1 mg/dL — AB (ref 0.4–1.2)
Chloride: 100 mEq/L (ref 96–112)
GFR: 25.11 mL/min — ABNORMAL LOW (ref >60.00)
GLUCOSE: 228 mg/dL — AB (ref 70–99)
Potassium: 5.1 mEq/L (ref 3.5–5.1)
Sodium: 135 mEq/L (ref 135–145)

## 2014-01-31 MED ORDER — FUROSEMIDE 80 MG PO TABS: 80.0000 mg | ORAL_TABLET | Freq: Every day | ORAL | Status: DC

## 2014-01-31 NOTE — Progress Notes (Signed)
Subjective:    Patient ID: [name] RangeSarah Short, female    DOB: 15-Feb-1941, 73 y.o.   MRN: [id number]  HPI Here with husband, daughter and caregiver Saw Dr Malvin JohnsPotter today-- increased evening clonazepam Having her stop the namenda just in case  Off the aldactone after elevated potassium and renal function Weight is stable from last week but up 11# from 2 weeks---clearly fluid  Some respiratory symptoms--- pants at times Has PND--wakes screaming Clearly has orthopnea No chest pain---but does point to abdomen as pain (especially after coughing)  Needs more care now Aide has to feed her Still goes to bathroom More tantrums---stomping feet, refused pills, throwing food  Current Outpatient Prescriptions on File Prior to Visit  Medication Sig Dispense Refill  . aspirin 81 MG tablet Take 81 mg by mouth daily.      . citalopram (CELEXA) 20 MG tablet Take 1 tablet (20 mg total) by mouth daily.  30 tablet  11  . furosemide (LASIX) 40 MG tablet Take 1 tablet (40 mg total) by mouth daily.  30 tablet  6  . glucose blood (ONE TOUCH TEST STRIPS) test strip Check blood sugar twice a week and as directed. Dx 250.60  100 each  1  . metoprolol succinate (TOPROL-XL) 25 MG 24 hr tablet TAKE 1 TABLET (25 MG TOTAL) BY MOUTH DAILY. TAKE WITH OR IMMEDIATELY FOLLOWING A MEAL.  30 tablet  6  . mirtazapine (REMERON) 15 MG tablet Take 1 tablet (15 mg total) by mouth at bedtime.  30 tablet  5  . nitroGLYCERIN (NITROSTAT) 0.4 MG SL tablet Place 1 tablet (0.4 mg total) under the tongue every 5 (five) minutes as needed for chest pain.  90 tablet  3   No current facility-administered medications on file prior to visit.    Allergies  Allergen Reactions  . Ciprofloxacin Rash    Past Medical History  Diagnosis Date  . Hypercholesteremia   . CAD (coronary artery disease)prior stents LAD. Cx Diagonal 2003   . Occlusion and stenosis of carotid artery with cerebral infarction     <15% R <50% L 2012  . Hypertension     . Mitral regurgitation     Mod-severe  . Dementia   . Type II or unspecified type diabetes mellitus with neurological manifestations, not stated as uncontrolled   . Sleep disturbance, unspecified   . Anxiety   . Osteoarthritis, shoulder   . Chronic kidney disease   . MI (myocardial infarction)     x3  . Asthma     hx  . PVC (premature ventricular contraction)   . Chronic systolic heart failure 05/2013    EF 25-30% with moderate to severe mitral regurgitation    Past Surgical History  Procedure Laterality Date  . Tonsillectomy    . Cardiac catheterization  2003  . Cardiac catheterization  2011  . Cardiac catheterization  2012    Family History  Problem Relation Age of Onset  . Other Mother     Diabetes Mellitus  . Cancer Father   . Coronary artery disease Other   . Alzheimer's disease Sister   . Heart disease Brother   . Cancer Brother     brain tumor  . Hypertension Maternal Grandmother     History   Social History  . Marital Status: Married    Spouse Name: N/A    Number of Children: 3  . Years of Education: N/A   Occupational History  . Not on file.   Social  History Main Topics  . Smoking status: Never Smoker   . Smokeless tobacco: Never Used  . Alcohol Use: No     Comment: Wine occcassionally  . Drug Use: No  . Sexual Activity: Not on file   Other Topics Concern  . Not on file   Social History Narrative   Has living will   Daughter Victorino DikeJennifer is health care POA   Has DNR order   No tube feeds if cognitively unaware   Review of Systems Still not eating Serious decline     Objective:   Physical Exam  Neck: Normal Short of motion. Neck supple.  Cardiovascular: Normal rate and normal heart sounds.   No murmur heard. irregular  Pulmonary/Chest:  Some decreased breath sounds at right base and ?dullness Clear on left  Musculoskeletal:  2+ calf edema  Lymphadenopathy:    She has no cervical adenopathy.  Neurological:  Somnolent  Doesn't  engage much          Assessment & Plan:

## 2014-01-31 NOTE — Assessment & Plan Note (Signed)
Not eating Functional decline Another reason for hospice

## 2014-01-31 NOTE — Progress Notes (Signed)
Pre visit review using our clinic review tool, if applicable. No additional management support is needed unless otherwise documented below in the visit note. 

## 2014-01-31 NOTE — Assessment & Plan Note (Signed)
Will need to increase diuretic for symptoms relief Recheck potassium now

## 2014-01-31 NOTE — Telephone Encounter (Signed)
[name] called a critical BUN - 101. [name] Short is contacting Dr Alphonsus SiasLetvak with results

## 2014-01-31 NOTE — Assessment & Plan Note (Signed)
10# weight gain quickly Hyperkalemia on the aldactone Severe renal disease Will refer again to hospice!!!  Increase furosemide===will put up with the renal insufficiency (since asymptomatic)

## 2014-02-01 NOTE — Telephone Encounter (Signed)
Not unexpected Have referred for hospice care Will discuss with the daughter

## 2014-02-07 ENCOUNTER — Encounter: Payer: Self-pay | Admitting: Internal Medicine

## 2014-02-07 ENCOUNTER — Ambulatory Visit (INDEPENDENT_AMBULATORY_CARE_PROVIDER_SITE_OTHER): Payer: Medicare Other | Admitting: Internal Medicine

## 2014-02-07 VITALS — BP 96/60 | HR 80 | Temp 98.2°F | Wt 136.5 lb

## 2014-02-07 DIAGNOSIS — G479 Sleep disorder, unspecified: Secondary | ICD-10-CM

## 2014-02-07 DIAGNOSIS — N185 Chronic kidney disease, stage 5: Secondary | ICD-10-CM

## 2014-02-07 DIAGNOSIS — F0280 Dementia in other diseases classified elsewhere without behavioral disturbance: Secondary | ICD-10-CM

## 2014-02-07 DIAGNOSIS — I2510 Atherosclerotic heart disease of native coronary artery without angina pectoris: Secondary | ICD-10-CM

## 2014-02-07 DIAGNOSIS — I5023 Acute on chronic systolic (congestive) heart failure: Secondary | ICD-10-CM

## 2014-02-07 NOTE — Progress Notes (Signed)
Pre visit review using our clinic review tool, if applicable. No additional management support is needed unless otherwise documented below in the visit note. 

## 2014-02-07 NOTE — Assessment & Plan Note (Addendum)
Some better Weight down by 6.5#---needs to lose more fluid Will continue the bid furosemide 80  Needs to sleep upright in hospital bed Should have oxygen for bedtime and prn

## 2014-02-07 NOTE — Assessment & Plan Note (Signed)
Will recheck labs next visit Now on hospice care

## 2014-02-07 NOTE — Assessment & Plan Note (Signed)
Still on mirtazapine Hopefully will be better if CHF better controlled Try less trazodone

## 2014-02-07 NOTE — Assessment & Plan Note (Signed)
Very dependent for her care Not sleeping well--not sure if that is from the orthopnea, etc Off the memantine--may be reason for some increase in sundowning (but not clear cut) Will try to wean the trazodone to decrease daytime somnolence

## 2014-02-07 NOTE — Progress Notes (Signed)
Subjective:    Patient ID: [name] Short, female    DOB: 1940-12-19, 73 y.o.   MRN: [id number]  HPI Here with husband, son and aide  Weight is down some Has diuresed Breathing is better Still has to sleep propped up---still orthopnea  Does screaming and yelling at night Doesn't seem to get great effect from the trazodone Sleeps a lot in the chair during the day Sundowning in evening also Aide tries to keep her up during the day--but not successful  Clonazepam made her worse---made her "like sleep walk"  Current Outpatient Prescriptions on File Prior to Visit  Medication Sig Dispense Refill  . aspirin 81 MG tablet Take 81 mg by mouth daily.      . citalopram (CELEXA) 20 MG tablet Take 1 tablet (20 mg total) by mouth daily.  30 tablet  11  . furosemide (LASIX) 80 MG tablet Take 1 tablet (80 mg total) by mouth daily.  30 tablet  3  . glucose blood (ONE TOUCH TEST STRIPS) test strip Check blood sugar twice a week and as directed. Dx 250.60  100 each  1  . metoprolol succinate (TOPROL-XL) 25 MG 24 hr tablet TAKE 1 TABLET (25 MG TOTAL) BY MOUTH DAILY. TAKE WITH OR IMMEDIATELY FOLLOWING A MEAL.  30 tablet  6  . mirtazapine (REMERON) 15 MG tablet Take 1 tablet (15 mg total) by mouth at bedtime.  30 tablet  5  . nitroGLYCERIN (NITROSTAT) 0.4 MG SL tablet Place 1 tablet (0.4 mg total) under the tongue every 5 (five) minutes as needed for chest pain.  90 tablet  3   No current facility-administered medications on file prior to visit.    Allergies  Allergen Reactions  . Ciprofloxacin Rash    Past Medical History  Diagnosis Date  . Hypercholesteremia   . CAD (coronary artery disease)prior stents LAD. Cx Diagonal 2003   . Occlusion and stenosis of carotid artery with cerebral infarction     <15% R <50% L 2012  . Hypertension   . Mitral regurgitation     Mod-severe  . Dementia   . Type II or unspecified type diabetes mellitus with neurological manifestations, not stated as  uncontrolled   . Sleep disturbance, unspecified   . Anxiety   . Osteoarthritis, shoulder   . Chronic kidney disease   . MI (myocardial infarction)     x3  . Asthma     hx  . PVC (premature ventricular contraction)   . Chronic systolic heart failure 05/2013    EF 25-30% with moderate to severe mitral regurgitation    Past Surgical History  Procedure Laterality Date  . Tonsillectomy    . Cardiac catheterization  2003  . Cardiac catheterization  2011  . Cardiac catheterization  2012    Family History  Problem Relation Age of Onset  . Other Mother     Diabetes Mellitus  . Cancer Father   . Coronary artery disease Other   . Alzheimer's disease Sister   . Heart disease Brother   . Cancer Brother     brain tumor  . Hypertension Maternal Grandmother     History   Social History  . Marital Status: Married    Spouse Name: N/A    Number of Children: 3  . Years of Education: N/A   Occupational History  . Not on file.   Social History Main Topics  . Smoking status: Never Smoker   . Smokeless tobacco: Never Used  . Alcohol  Use: No     Comment: Wine occcassionally  . Drug Use: No  . Sexual Activity: Not on file   Other Topics Concern  . Not on file   Social History Narrative   Has living will   Daughter Victorino DikeJennifer is health care POA   Has DNR order   No tube feeds if cognitively unaware   Review of Systems Appetite is some better--but still not great Bowels every other day     Objective:   Physical Exam  Constitutional: She appears well-developed. No distress.  Neck: Normal range of motion.  Cardiovascular: Normal rate, regular rhythm and normal heart sounds.  Exam reveals no gallop.   No murmur heard. Pulmonary/Chest: Effort normal and breath sounds normal. No respiratory distress. She has no wheezes. She has no rales.  Abdominal: Soft. There is no tenderness.  Musculoskeletal:  Still with fullness in calves  Lymphadenopathy:    She has no cervical  adenopathy.  Neurological:  Somnolent Doesn't engage unless directly asked question (then very limited)          Assessment & Plan:

## 2014-02-10 ENCOUNTER — Ambulatory Visit: Payer: Medicare Other | Admitting: Gastroenterology

## 2014-02-10 DIAGNOSIS — I2510 Atherosclerotic heart disease of native coronary artery without angina pectoris: Secondary | ICD-10-CM

## 2014-02-10 DIAGNOSIS — I5023 Acute on chronic systolic (congestive) heart failure: Secondary | ICD-10-CM

## 2014-02-10 DIAGNOSIS — N184 Chronic kidney disease, stage 4 (severe): Secondary | ICD-10-CM

## 2014-02-10 DIAGNOSIS — I129 Hypertensive chronic kidney disease with stage 1 through stage 4 chronic kidney disease, or unspecified chronic kidney disease: Secondary | ICD-10-CM

## 2014-02-13 ENCOUNTER — Ambulatory Visit: Payer: Medicare Other | Admitting: Cardiovascular Disease

## 2014-02-15 ENCOUNTER — Other Ambulatory Visit: Payer: Self-pay | Admitting: *Deleted

## 2014-02-15 MED ORDER — NITROGLYCERIN 0.4 MG SL SUBL: 0.4000 mg | SUBLINGUAL_TABLET | SUBLINGUAL | Status: AC | PRN

## 2014-02-17 ENCOUNTER — Telehealth: Payer: Self-pay | Admitting: Internal Medicine

## 2014-02-17 NOTE — Telephone Encounter (Signed)
Phone call from Cindy--hospice RN Husband is unable to manage her care and needs help They are [facility] Then they may be looking for facility placement for both (social worker will work on this)

## 2014-02-21 ENCOUNTER — Ambulatory Visit: Payer: Medicare Other | Admitting: Internal Medicine

## 2014-02-21 DIAGNOSIS — Z0289 Encounter for other administrative examinations: Secondary | ICD-10-CM

## 2014-02-22 ENCOUNTER — Telehealth: Payer: Self-pay | Admitting: *Deleted

## 2014-02-23 NOTE — Telephone Encounter (Signed)
I had heard I will try to reach her husband to pass on condolences

## 2014-03-13 ENCOUNTER — Ambulatory Visit: Payer: Medicare Other | Admitting: Cardiovascular Disease

## 2014-03-13 NOTE — Telephone Encounter (Signed)
Took a message off my voicemail from the hospice nurse that patient passed this morning at 11:13am

## 2014-03-13 DEATH — deceased

## 2014-05-02 ENCOUNTER — Ambulatory Visit: Payer: Medicare Other | Admitting: Internal Medicine

## 2014-07-11 IMAGING — CT CT CHEST-ABD-PELV W/ CM
1 of 3 series · 14 of 32 positions shown, 19 images · non-contrast
Comparison: None

REASON FOR EXAM: (1) hyponatremia- r/o mass; (2) hyponatremia- r/o cancer
COMMENTS:

PROCEDURE:     CT  - CT CHEST ABDOMEN AND PELVIS W  - April 29, 2013 [DATE]
RESULT:     CT CHEST, ABDOMEN, AND PELVIS
HISTORY: Hyponatremia
TECHNIQUE: Multiple axial images obtained from the thoracic inlet to the
pubic symphysis, with p.o. contrast and with 80 ml of [id number] intravenous
contrast.

[Series 2: soft tissue · axial · 0.75mm/px · z∈[-1218,-660]mm · 14 of 212 slices shown, 19 images]
[im 13/212  soft-tissue]
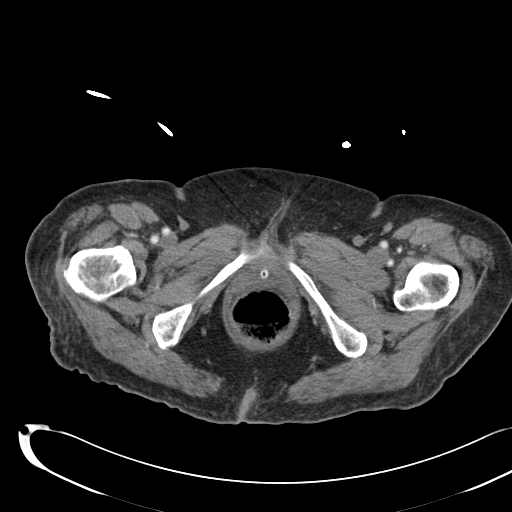
[im 13/212  bone]
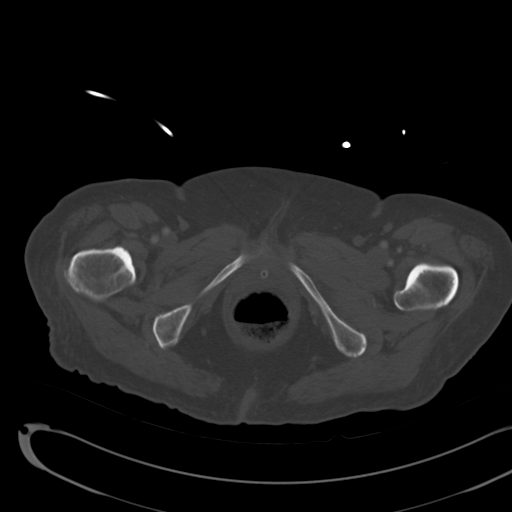
[im 25/212  soft-tissue]
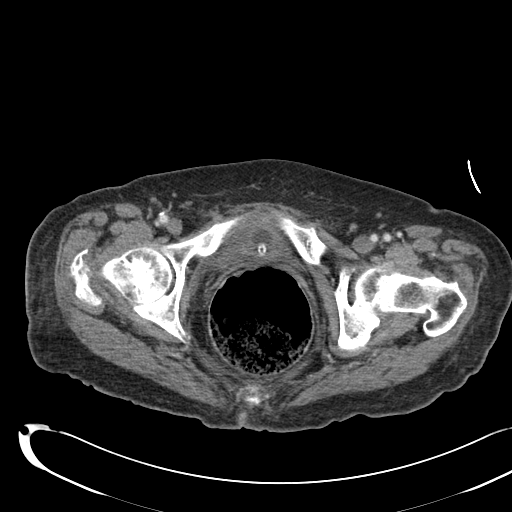
[im 50/212  soft-tissue]
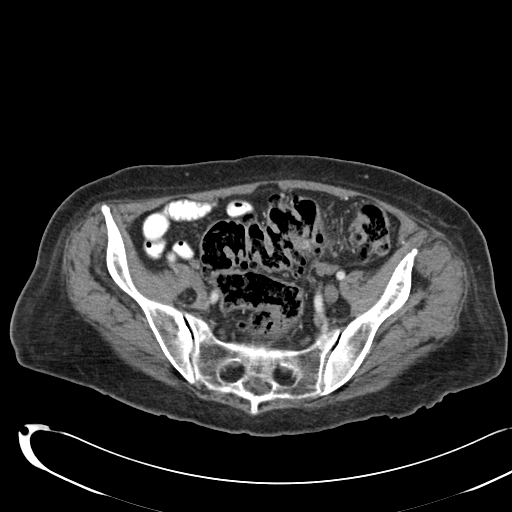
[im 63/212  soft-tissue]
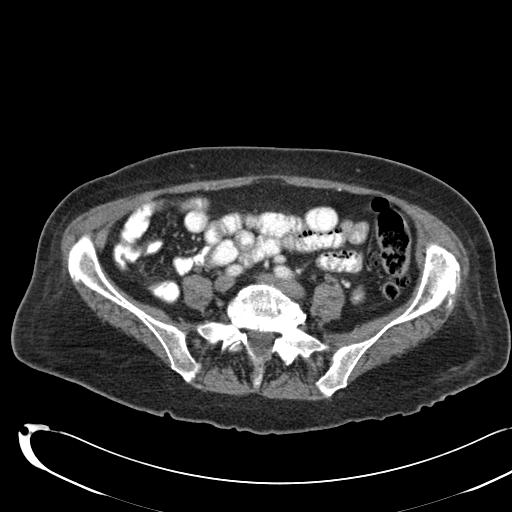
[im 75/212  soft-tissue]
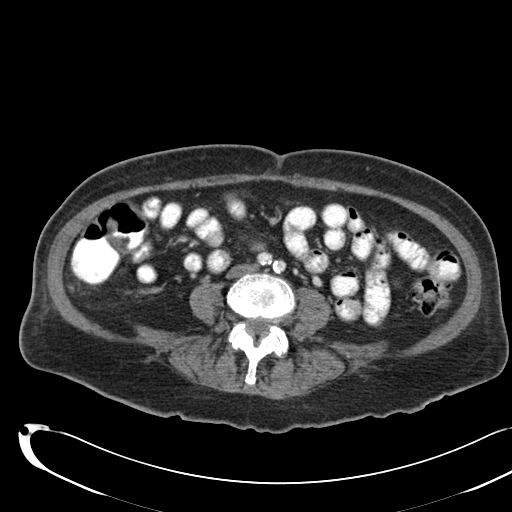
[im 87/212  soft-tissue]
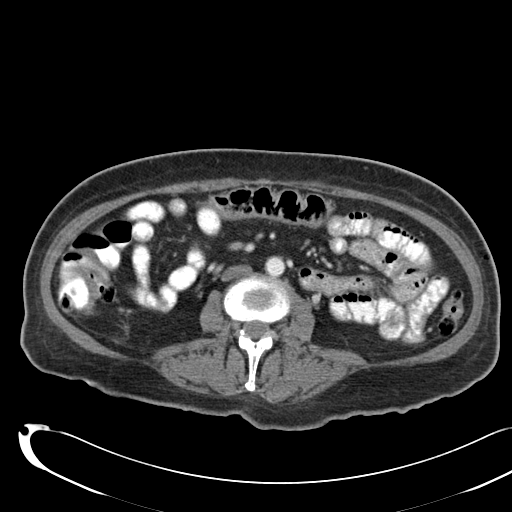
[im 112/212  soft-tissue]
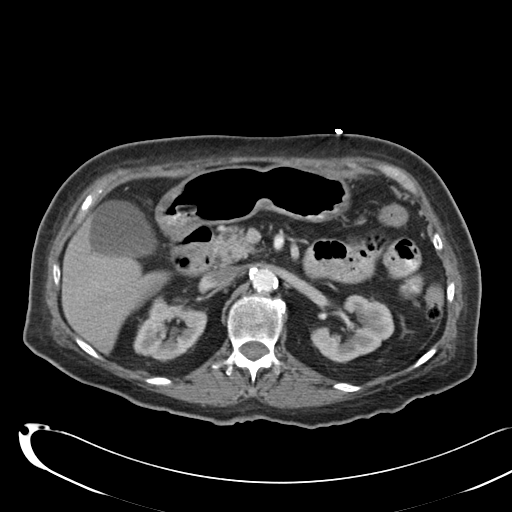
[im 125/212  soft-tissue]
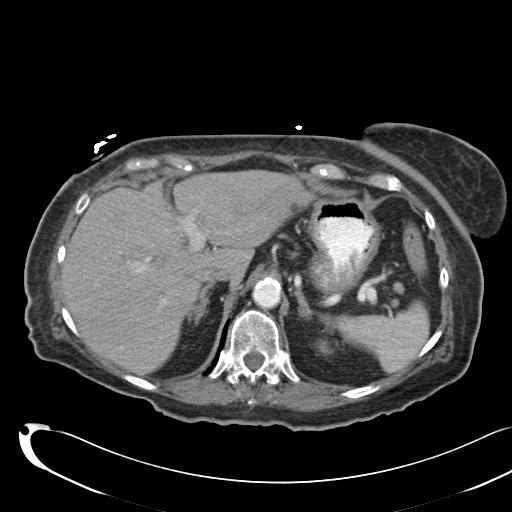
[im 137/212  soft-tissue]
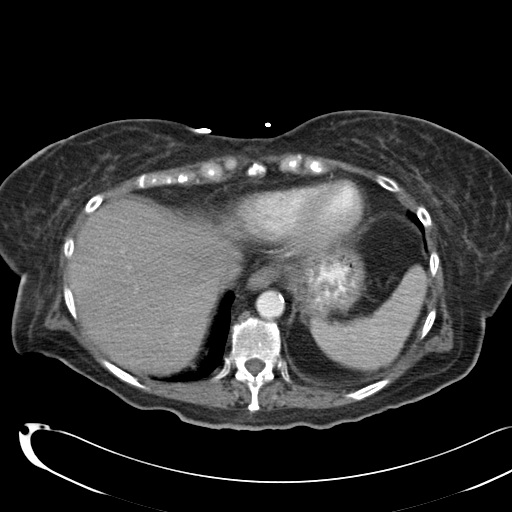
[im 137/212  bone]
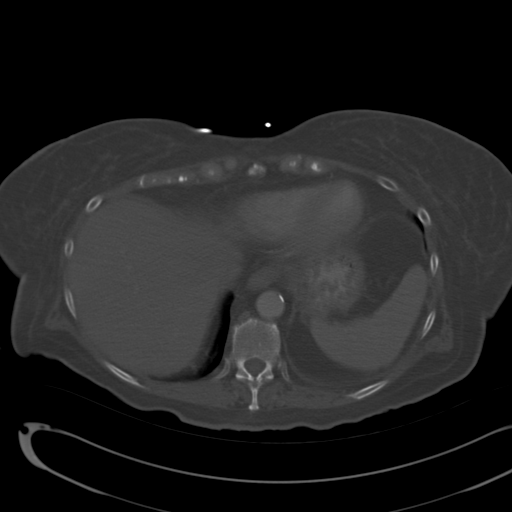
[im 149/212  soft-tissue]
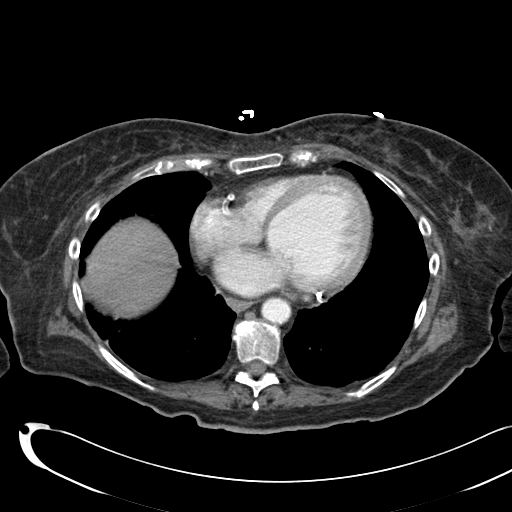
[im 162/212  soft-tissue]
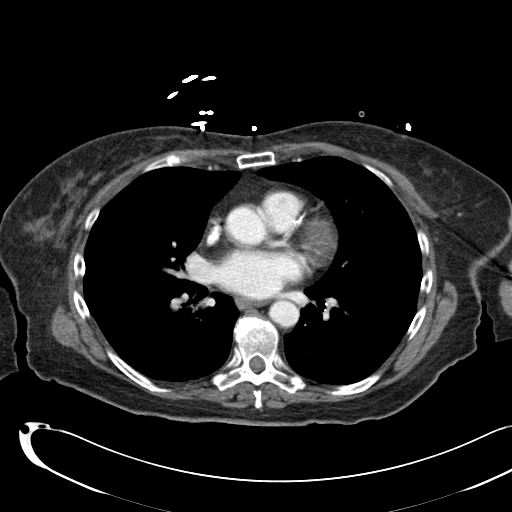
[im 162/212  lung]
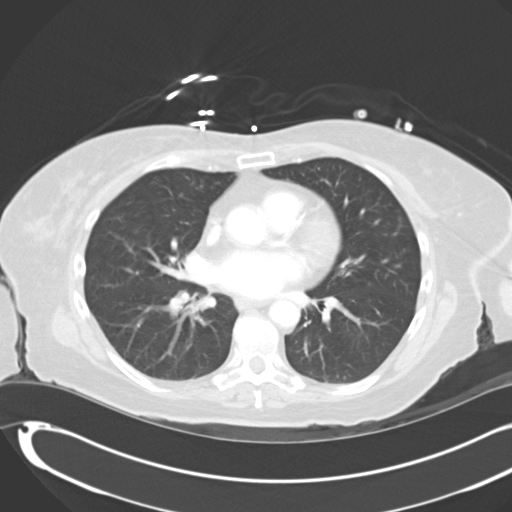
[im 174/212  lung]
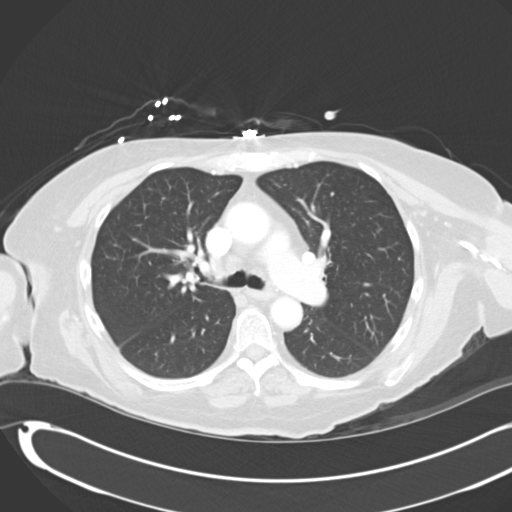
[im 187/212  soft-tissue]
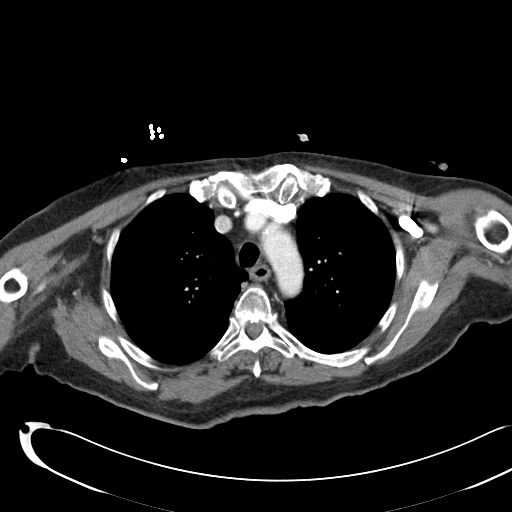
[im 187/212  lung]
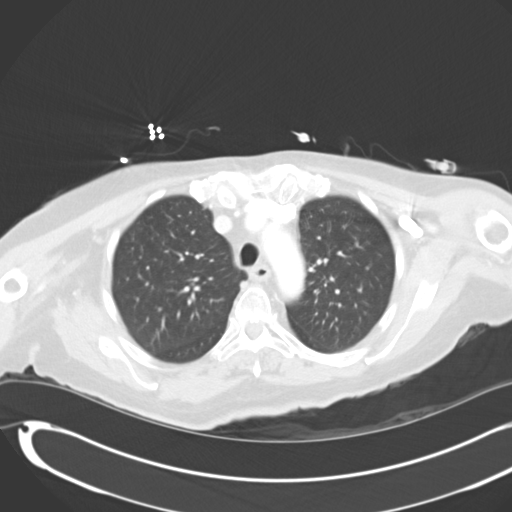
[im 199/212  soft-tissue]
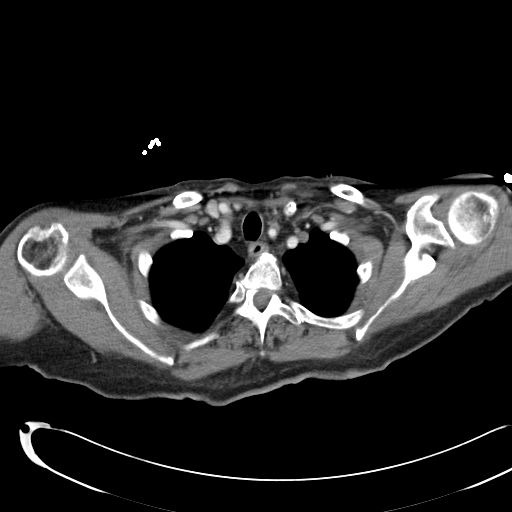
[im 199/212  lung]
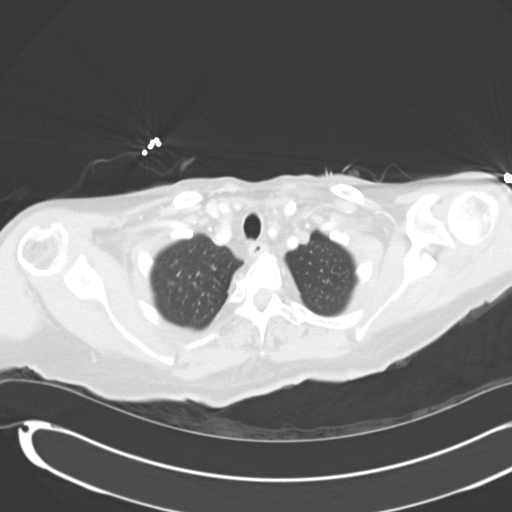

[14 of 32 positions shown; findings below may reference images not displayed]

FINDINGS: CHEST:

The lungs are clear. There is no focal mass. There is no focal parenchymal
opacity, pleural effusion, or pneumothorax.

The heart size is normal. There is no pericardial effusion. There is
coronary artery atherosclerosis.

There are no pathologically enlarged mediastinal, hilar, or axillary lymph
nodes.

The osseous structures demonstrate no focal abnormality.

ABDOMEN/PELVIS:

The liver demonstrates no focal abnormality. There is no intrahepatic or
extrahepatic biliary ductal dilatation. The gallbladder is unremarkable. The
spleen demonstrates no focal abnormality. The kidneys, adrenal glands,
pancreas are normal. There is a Foley catheter in the bladder.

The stomach, duodenum, small intestine, and large intestine demonstrate no
contrast extravasation or dilatation. There is diverticulosis without
evidence of diverticulitis. There is no pneumoperitoneum, pneumatosis, or
portal venous gas. There is no abdominal or pelvic free fluid. There is no
lymphadenopathy.

The abdominal aorta is normal in caliber with atherosclerosis.

The osseous structures are unremarkable.
IMPRESSION: 1. No acute disease of the chest, abdomen or pelvis.

[REDACTED]

## 2015-02-02 NOTE — Consult Note (Signed)
Brief Consult Note: Diagnosis: small NSTEMI , known CAD.   Patient was seen by consultant.   Consult note dictated.   Comments: Patient and family do not wish for invasive procedure especially with advanced dementia.  Continue Medical therapy. Heparin can be stopped tomorrow. Consider adding Plavix.  Electronic Signatures: Lorine BearsArida, Tomothy Eddins (MD)  (Signed 18-Aug-14 18:09)  Authored: Brief Consult Note   Last Updated: 18-Aug-14 18:09 by Lorine BearsArida, Montia Haslip (MD)

## 2015-02-02 NOTE — Consult Note (Signed)
PATIENT NAME:  [name] Short, TWILLEY MR#:  [id number] DATE OF BIRTH:  [birth year]  DATE OF CONSULTATION:  05/30/2013  REFERRING PHYSICIAN:  Dr. Randol Kern.  CONSULTING PHYSICIAN:  Muhammad A. Kirke Corin, MD  PRIMARY CARE PHYSICIAN: Dr. Terance Hart.   PRIMARY CARDIOLOGIST: Dr. Graciela Husbands.   REASON FOR CONSULTATION: Chest pain.   HISTORY OF PRESENT ILLNESS: This is a 74 year old female with known history of advanced dementia, depression, coronary artery disease with previous stenting of the LAD/diagonal, diabetes and hyperlipidemia. She presented to the Emergency Room this morning with chest pain which happened about 1 hour after she went to sleep. The chest pain continued from midnight until 3:00, and, thus, the patient was brought to the Emergency Room. EKG showed left bundle branch block which is not new. Troponin was mildly elevated at 0.16. The patient has advanced dementia and currently does not remember having any chest pain this morning. According to family, she had substernal chest tightness with dyspnea. She is not able to provide any further details. She did have some chest discomfort back in March when she was seen by Dr. Graciela Husbands. She underwent a nuclear stress test which showed possible old anterior infarct without significant ischemia. Ejection fraction was reduced on nuclear stress test but normal on echo.   REVIEW OF SYSTEMS: Unable to obtain at this time due to advanced dementia.   PAST MEDICAL HISTORY:  1. Advanced dementia.  2. Coronary artery disease, status post stenting of the LAD/diagonal. Most recent cardiac catheterization was in 2012 which showed patent stents.  3. Depression.  4. Type 2 diabetes.  5. Hypertension.  6. Hyperlipidemia.   ALLERGIES: No known drug allergies.   SOCIAL HISTORY: Negative for smoking, alcohol or recreational drug use. She lives with her husband and caregiver.   FAMILY HISTORY: Negative for premature coronary artery disease.   HOME MEDICATIONS: Include:  1.  Aspirin 81 mg daily.  2. Lopressor 100 mg twice daily.  3. Aldactone 12.5 mg once daily.  4. Celexa 20 mg once daily.  5. Metformin 1000 mg once daily.  6. Lipitor 40 mg daily.  7. Benicar hydrochlorothiazide once daily.  8. Namenda.  9. Aricept 10 mg daily.   PHYSICAL EXAMINATION:  GENERAL: The patient appears to be at her stated age, in no acute distress.  VITAL SIGNS: Temperature is 98.1, pulse is 72, respiratory rate is 18, blood pressure is 126/66 and oxygen saturation is 99% on room air.  HEENT: Normocephalic, atraumatic.  NECK: No JVD or carotid bruits.  RESPIRATORY: Normal respiratory effort with no use of accessory muscles. Auscultation reveals normal breath sounds.  CARDIOVASCULAR: Normal PMI. Normal S1 and S2 with no gallops or murmurs.  ABDOMEN: Benign, nontender and nondistended.  EXTREMITIES: No clubbing, cyanosis or edema.   LABORATORY AND DIAGNOSTIC DATA: Her renal function is normal. Troponin was 0.16 and subsequently was 0.15. CPK and CK-MB were normal. White cell count was 11.1 with a hemoglobin of 10.5. ECG showed sinus rhythm with left bundle branch block.   IMPRESSION:  1. Small non-ST elevation myocardial infarction with known history of coronary artery disease.  2. Advanced dementia.  3. Hypertension.   RECOMMENDATIONS: The patient's history is suggestive of unstable angina with slightly elevated troponin consistent with a small non-ST elevation myocardial infarction. The patient currently is chest pain-free and does not have any recollection of the symptoms that she had. I had a prolonged discussion with the patient and her family about management options. She did have a stress test done in March of  this year which showed no clear ischemia. However, with her current presentation and previous cardiac history, she likely has new findings. After extensive discussion, the patient and family prefer not to undergo another cardiac catheterization due to her advanced  dementia. Due to that, I recommend continuing medical therapy with current medications and considering adding treatment with Plavix. Heparin can likely be discontinued tomorrow. The patient can be ambulated and if she is chest pain-free, she can likely be discharged back home.   ____________________________ Chelsea AusMuhammad A. Kirke CorinArida, MD maa:gb D: 05/30/2013 18:15:56 ET T: 05/30/2013 21:25:11 ET JOB#: [id number]  cc: Jerolyn CenterMuhammad A. Kirke CorinArida, MD, <Dictator> Iran OuchMUHAMMAD A ARIDA MD ELECTRONICALLY SIGNED 06/02/2013 7:46

## 2015-02-02 NOTE — H&P (Signed)
PATIENT NAME:  [name] Short, [name] Short MR#:  [id number] DATE OF BIRTH:  [birth year]  DATE OF ADMISSION:  06/07/2013  PRIMARY CARE PHYSICIAN: Dr. Terance HartBronstein.   CHIEF COMPLAINT: Shortness of breath.   HISTORY OF PRESENT ILLNESS: This is a 74 year old female who presents to the hospital due to worsening shortness of breath over the past few days. The patient herself has baseline dementia, therefore, most of the history obtained from the caregiver and the husband at bedside. As per the caregiver and the husband, the patient has been having supposedly panic attacks where she has been complaining of shortness of breath all the time. This morning, she was feeling more short of breath, and she had increasing work of breathing. She was, therefore, brought to the ER for further evaluation. The patient also has been noticing that she has had some increasing peripheral edema over the past few days. She was also noted to have a sodium of 117 on admission here in the Emergency Room. Her chest x-ray findings are suggestive of pulmonary edema and bilateral pleural effusions consistent with volume overload. Hospitalist services were contacted for further treatment and evaluation.   REVIEW OF SYSTEMS:  CONSTITUTIONAL:  No documented fever. Positive weight gain, but unclear what amount.  No weight loss.  EYES: No blurred or double vision.  ENT: No tinnitus. No postnasal drip. No redness of the oropharynx.  RESPIRATORY: No cough. No wheeze. No hemoptysis. Positive dyspnea.  CARDIOVASCULAR: No chest pain. No orthopnea. No palpitations. No syncope.  GASTROINTESTINAL: No nausea. No vomiting. No diarrhea. No abdominal pain. No melena or hematochezia.  GENITOURINARY: No dysuria or hematuria.  ENDOCRINE: No polyuria or nocturia. No heat or cold  intolerance.  HEMATOLOGIC: No anemia. No bruising. No bleeding.  INTEGUMENTARY: No rashes. No lesions.  MUSCULOSKELETAL: No arthritis. No swelling. No gout.  NEUROLOGIC: No numbness or  tingling. No ataxia. No seizure activity.  PSYCHIATRIC: No anxiety. No insomnia. No ADD. Positive dementia.   PAST MEDICAL HISTORY: Consistent with hypertension, dementia, diabetes, hyperlipidemia, history of recent non-ST-elevation MI.   ALLERGIES:  CIPROFLOXACIN.   SOCIAL HISTORY: No smoking. No alcohol abuse. No illicit drug abuse. Lives at home with her husband.   FAMILY HISTORY: Mother died from old age. Father died from liver cancer.   CURRENT MEDICATIONS: Are as follows: Aspirin 81 mg daily, Benicar/HCTZ 25/40, 1 tab daily, Plavix 75 mg daily, Depakote 125 mg b.i.d., Aricept 5 mg daily, Lipitor 40 mg daily, Lopressor 100 mg b.i.d., metformin 1000 mg daily, Namenda 10 mg b.i.d.   PHYSICAL EXAMINATION: Presently is as follows: VITAL SIGNS: Are noted to be:  She is afebrile. Pulse 89, respirations 20, blood pressure 135/83, sats 98% on 2 liters nasal cannula.  GENERAL: She is a pleasant-appearing female in mild to moderate respiratory distress.  HEAD, EYES, EARS, NOSE, THROAT: Atraumatic, normocephalic. Her extraocular muscles are intact. Pupils are equal and reactive eye to light. Sclerae anicteric. No conjunctival injection. No pharyngeal erythema.  NECK: Supple. There is no jugular venous distention. No bruits. No lymphadenopathy or thyromegaly.  HEART: Regular rate and rhythm. No murmurs. No rubs. No clicks.  LUNGS:  She has poor respiratory effort. Bibasilar crackles. Positive use of accessory muscles. No dullness to percussion.  ABDOMEN: Soft, flat, nontender, nondistended. Has good bowel sounds. No hepatosplenomegaly appreciated.  EXTREMITIES: No evidence of any cyanosis, clubbing. She does have +1 to 2 pitting edema from the knees to ankles bilaterally.  SKIN: Moist and warm with no rashes appreciated.  NEUROLOGIC: She is alert,  awake, oriented x 2. Moves all extremities spontaneously. No other focal motor or sensory deficits appreciated bilaterally.  LYMPHATIC: There is no  cervical or axillary lymphadenopathy.   LABORATORY DATA: Showed a serum glucose of 239, BUN 25, creatinine 1.16, sodium 117, potassium 4.5, chloride 87, bicarb 19. The patient's LFTs are within normal limits. Troponin 0.07. White cell count 15, hemoglobin 11, hematocrit 33, platelet count of 59. Urinalysis showed 1+ leukocyte esterase with 25 white cells and 3+ bacteria.   ASSESSMENT AND PLAN: This is a 74 year old female with history of recent non-ST elevation myocardial infarction, hypertension, dementia, diabetes, hyperlipidemia, presents to the hospital with shortness of breath and also noted to be acutely hyponatremic.  1.  Acute respiratory failure. I suspect this is secondary to congestive heart failure. The patient appears volume overloaded with worsening lower extremity edema and chest x-ray findings suggestive of bilateral pleural effusions and pulmonary edema. I will diurese her with IV Lasix, follow I's and O's and daily weights. I will get an arterial blood gas now. We will also check a 2-dimensional echocardiogram. Follow her clinically.  2.  Congestive heart failure. This is likely acute on chronic systolic dysfunction. On a previous Myoview, her EF was 40%, although I will go ahead and get a 2-dimensional echocardiogram. Diuresis her with IV Lasix. Follow I's and O's and daily weights. Continue her beta blocker for now.  3.  Hyponatremia. This is acute in nature. She was just discharged from the hospital about a week or so ago. Her sodium was 135 then. It is currently 117. I suspect this is probably related to volume overload, given her clinical presentation. For now, I will diurese her with IV Lasix and follow her sodium. I will get a nephrology consult in the morning. I will check a urine sodium, urine chloride, serum and urine osmolality.  4.  Elevated troponin. This is likely in the setting of demand ischemia from the congestive heart failure. There is no evidence of acute coronary  syndrome. I will cycle her cardiac markers, get a 2-dimensional echocardiogram. The patient is followed by [facility] cardiology.  5.  Diabetes. Continue sliding scale insulin for now. Hold metformin.  6.  Hyperlipidemia. Continue atorvastatin.  7.  Dementia. Continue Aricept and Namenda.   CODE STATUS: The patient is a DO NOT INTUBATE/DO NOT RESUSCITATE.   TIME SPENT WITH ADMISSION: 50 minutes.    ____________________________ Rolly Pancake. Cherlynn Kaiser, MD vjs:dmm D: 06/07/2013 19:30:13 ET T: 06/07/2013 20:02:41 ET JOB#: [id number]  cc: Rolly Pancake. Cherlynn Kaiser, MD, <Dictator> [place] Siren MD ELECTRONICALLY SIGNED 06/25/2013 15:08

## 2015-02-02 NOTE — H&P (Signed)
PATIENT NAME:  [name] Short, [name] Short MR#:  [id number] DATE OF BIRTH:  05-04-1941  DATE OF ADMISSION:  05/30/2013  REFERRING PHYSICIAN:  Dr. Mordecai Maes. PRIMARY CARE PHYSICIAN: Dr. Terance Hart.  PRIMARY CARDIOLOGIST:  Dr. Graciela Husbands from [facility].   CHIEF COMPLAINT: Chest pain and shortness of breath.   HISTORY OF PRESENT ILLNESS: This is a 74 year old female with history of dementia, depression, coronary artery disease, diabetes, hyperlipidemia, who presents today with complaints of chest pain. The patient has severe dementia, cannot give any reliable history or review of systems. The patient reported complaints of chest pain and shortness of breath to her caregiver, who called EMS. The patient was given aspirin 324 mg en route. In the ED, the patient had an EKG done, which did show old left bundle branch block. She had elevated troponin at 0.16, so she was started on heparin drip for non-ST-elevated MI. As well, she received nitro paste. The patient, as well, has some complaints of shortness of breath, but currently denies any. As well, she denies she had the chest pain to start with, but she is a very unreliable historian, cannot get any reliable history from her or review of systems secondary to her advanced dementia. The patient is known to have history of coronary artery disease. She has history of 7 stents in the past. She is already on aspirin on a daily basis. The patient lives with her husband and with a caregiver. Her daughter, who is her healthcare power of attorney, was present, and she reports her mother is DNR.  REVIEW OF SYSTEMS:  Unable to obtain reliable review of systems secondary to the patient's dementia.   PAST MEDICAL HISTORY: 1.  Dementia. The patient is on Namenda and donepezil.  2.  Depression.  3.  Coronary artery disease status post 7 seconds, followed by Dr. Graciela Husbands from [facility] cardiology.  4.  History of two MIs, non-ST-elevation.  5.  Diabetes.  6.  Hyperlipidemia.  7.   Hypertension.   ALLERGIES: No known drug allergies.   PAST SURGICAL HISTORY: Seven stents.   SOCIAL HISTORY: The patient lives with her husband and caregiver. Nonsmoker. No alcohol. No illicit drugs.   FAMILY HISTORY: Significant for Alzheimer dementia in her mother. No CVA. No MIs at young age. Father died from complication of lung cancer.   HOME MEDICATIONS: 1.  Aspirin 81 mg oral daily.  2.  Lopressor 100 mg oral p.o. b.i.d.  3.  Aldactone 25 mg oral 1/2 tablet oral daily.  4.  Aspirin 81 mg oral daily.  5.  Celexa 20 mg oral daily.  6.  Metformin 1000 mg oral daily.  7.  Lipitor 40 mg oral daily.  8.  Benicar/hydrochlorothiazide 25/40 mg oral daily.  9.  Namenda 10 mg oral daily.  10.  Donepezil 10 mg oral at bedtime.   PHYSICAL EXAMINATION: VITAL SIGNS: Temperature 98, pulse 70, respiratory rate 16, blood pressure. Saturating 100% oxygen. GENERAL: Elderly female, looks comfortable, in no apparent distress.  HEENT: Head atraumatic, normocephalic. Pupils equal, reactive to light. Pink conjunctivae. Anicteric sclerae. Moist oral mucosa.  NECK: Supple. No thyromegaly. No JVD.  CHEST: Good air entry bilaterally. No wheezing, rales, rhonchi.  CARDIOVASCULAR: S1, S2 heard. No rubs, murmurs or gallops.  No carotid bruits.  ABDOMEN: Soft, nontender, nondistended. Bowel sounds present.  EXTREMITIES: No edema. No clubbing. No cyanosis. Good dorsalis pedis pulse felt bilaterally. Good radial pulse bilaterally. PSYCHIATRIC: The patient is awake, pleasant, conservative, but cannot give any reliable history, appears to be  confused.  NEUROLOGIC: Cranial nerves grossly intact. Motor appears grossly intact in all be extremities without significant deficits.  LYMPHATICS: No cervical or supraclavicular lymphadenopathy.  SKIN: Warm and dry. No rash.   PERTINENT LABORATORY DATA: Glucose 143, BUN 14, creatinine 1.07, sodium 139, potassium 3.5, chloride 107, CO2 of 27. Troponin 0.16, CK-MB 0.5.  White blood cells 11.1, hemoglobin 10.5, hematocrit 31.6, platelets 186. EKG normal sinus rhythm at 84 beats per minute with occasional PVCs and showing left bundle branch block.   ASSESSMENT AND PLAN: 1.  Acute coronary syndrome non-ST-elevated myocardial infarction. The patient presents with chest pain, shortness of breath, has left bundle branch block on her EKG, which appears to be old. Has elevated troponins. The patient will be admitted to tele. We will continue to cycle her cardiac enzymes. She already received aspirin. She is on heparin drip. She is already on beta blocker, statin and olmesartan. We will consult cardiology service, [place] [facility], who are familiar with the patient. Unclear when was the last echo done or last ejection fraction assessed for the patient. We will await further cardiology, as to have access to her cardiology records to see when was the most recent echo.  2.  Dementia. Appears to be vascular per history. Continue with Namenda and donepezil.  3.  History of depression. Continue with home meds.  4.  History of coronary artery disease. Continue with aspirin, statin, beta blocker, olmesartan.  5.  Diabetes. Hold metformin, start insulin sliding scale.  6.  Hyperlipidemia, on statin.  7.  Hypertension. Blood pressure acceptable. No changes in meds.  8.  Deep vein thrombosis prophylaxis.  The patient is on full dose anticoagulation.  9.  Gastrointestinal prophylaxis on proton pump inhibitor.   CODE STATUS: The daughter is present. She reports she is her power of attorney and reports the patient is DNR.   TOTAL TIME SPENT ON ADMISSION AND THE PATIENT CARE: 55 minutes.    ____________________________ Starleen Armsawood S. Link Burgeson, MD dse:dmm D: 05/30/2013 07:45:00 ET T: 05/30/2013 09:01:10 ET JOB#: [id number]  cc: Starleen Armsawood S. Amarri Michaelson, MD, <Dictator> Diedra Sinor Teena IraniS Shamal Stracener MD ELECTRONICALLY SIGNED 06/04/2013 12:13

## 2015-02-02 NOTE — Discharge Summary (Signed)
PATIENT NAME:  [name] Short, Jacqueli MR#:  [id number] DATE OF BIRTH:  October 29, 1940  DATE OF ADMISSION:  06/07/2013 DATE OF DISCHARGE:  06/09/2013  DISCHARGE DIAGNOSIS:  1.  Acute systolic heart failure.  2.  Acute respiratory failure, needed supplementation with oxygen on admission, resolved.  3.  Hyponatremia. 4.  Hypovolemic due to congestive heart failure.  5.  Hypertension.  6.  Diabetes.  7.  Urinary tract infection.  8.  Early dementia.  9.  Bilateral pleural effusions due to congestive heart failure.  10.  Panic attack.   CONDITION ON DISCHARGE: Stable.    DISCHARGE MEDICATIONS:   1.  Lipitor 40 mg oral tablet once a day.  2.  Namenda 10 mg oral tablet 2 times a day. 3.  Aspirin 81 mg once a day.  4.  Divalproex sodium 125 mg 2 times a day.  5.  Donepezil 5 mg once a day.  7.  Spironolactone 25 mg once a day.  8.  Lisinopril 10 mg once a day. 9.  Trazodone 25 mg once a day.  10.  Metformin 500 mg oral tablet extended-release once a day.  11.  Metoprolol tartrate 25 mg 2 times a day.  12.  Ceftin 250 mg 2 times a day.  13.  Furosemide 20 mg take 1/2 tablet every other day in the morning.   HOME HEALTH ON DISCHARGE: Yes.   HOME HEALTH SERVICES: Physical therapy nurse, nurse aide.  DIETARY SUPPLEMENT: Glucerna. Advised to use 3 times per day.   DIET CONSISTENCY: Regular.  ACTIVITY LIMITATION: As tolerated.   TIMEFRAME TO FOLLOW-UP: Within 1 to 2 weeks in cardiology clinic and Dr. Dorothey Basemanavid Bronstein.   HISTORY AND HOSPITAL COURSE:  1.  This is a 74 year old female who presented to the Emergency Room for worsening shortness of breath for the last few days. As per the patient's caregiver and husband, the patient has been having attacks of anxiety and panic attacks and at that time she gets very short of breath. She also had now increasing pedal edema, increasing water weight. She was brought to the ER. Sodium was 107 and chest x-ray findings suggestive of pulmonary edema. She was  admitted with IV Lasix injection and nephrology consult. The patient had significant diuresis and she had slight improvement in her sodium level also. The next day she was started on oral tolvaptan and after receiving that she had another 4 liters of diuresis within the next few hours and her sodium level improved from 117 on admission to 30.  2.  CHF. Ejection fraction was 25% to 30%. We added lisinopril and spironolactone. 3.  Acute respiratory failure due to CHF exacerbation. Advised family to try to taper off oxygen. 4.  Acute respiratory failure due to CHF exacerbation. We tapered down oxygen and she was able to tolerate on room.  5.  Left pleural effusion secondary to hyponatremia and CHF. Repeat chest x-ray showed almost same.  6.  Hyponatremia. Her caregiver and husband confirmed that the patient was drinking 2 to 3 liters of water every day as she had recent admission and was hypovolemic and hyponatremic and she was dehydrated. Nephro consult was called in for her hyponatremia and they agreed with the plan with tolvaptan.  7.  Diabetes. Sliding scale coverage.  8.  UTI. Continued Rocephin.  UA is positive.  9.  Panic attacks. We continued Depakote.  10.  Early dementia. Continued Namenda and Aricept.  11.  Hyperlipidemia. Aspirin, Plavix, statin and metoprolol continued. 12.  Coronary artery disease.   IMPORTANT LABORATORY AND DIAGNOSTICS: In the hospital: BNP was 20,600 on admission. Sodium was 117 and creatinine was 1.16. Urinalysis: 25 WBCs with 1+ leukocyte esterase.  Echocardiogram: As mentioned above, 25% to 30% ejection fraction.   TOTAL TIME SPENT ON THIS DISCHARGE: 40 minutes.  ____________________________ Hope Pigeon Elisabeth Pigeon, MD vgv:sb D: 06/14/2013 00:04:05 ET T: 06/14/2013 07:06:11 ET JOB#: [id number]  cc: Hope Pigeon. Elisabeth Pigeon, MD, <Dictator> Teena Irani. Terance Hart, MD Altamese Dilling MD ELECTRONICALLY SIGNED 06/17/2013 14:51

## 2015-02-02 NOTE — Discharge Summary (Signed)
PATIENT NAME:  [name] Short Short, [name] Short MR#:  [id number] DATE OF BIRTH:  Oct 20, 1940  DATE OF ADMISSION:  05/30/2013 DATE OF DISCHARGE:  05/31/2013  PRIMARY CARE PHYSICIAN: [name] Short Basemanavid Bronstein, MD   CONSULTATIONS: Cardiology, Dr. Kirke Short   DISCHARGE DIAGNOSES: 1.  Non ST-elevation myocardial infarction.  2.  Coronary artery disease.  3.  Hypertension.  4.  Diabetes.  5.  Dementia.   CODE STATUS: DNR.   CONDITION: Stable.   HOME MEDICATIONS: 1.  Lipitor 40 mg p.o. daily.  2.  Metformin 1000 mg p.o. daily.  3.  Lopressor 100 mg p.o. b.i.d.  4.  Namenda 10 mg p.o. b.i.d.  5.  Aspirin 81 mg p.o. daily.  6.  Celexa 20 mg p.o. daily.  7.  Benicar/HCTZ 25 mg/40 mg p.o. tablets 1 tab once a day.  8.  Aldactone 25 mg, 0.5 tablet once a day in the evening.  9.  Donepezil 10 mg p.o. 1 tablet once a day at bedtime.  10.  Nitroglycerin 0.4 mg sublingual 1 tab every 5 minutes p.r.n.  11.  Plavix 75 mg p.o. daily.   DIET: Low sodium diet, low fat, low cholesterol ADA diet.   ACTIVITY: As tolerated.  FOLLOW-UP CARE:  Follow up with PCP within 1 to 2 weeks. Follow up with Dr. Kirke Short within 1 to 2 weeks. The patient needs to resume home health.   REASON FOR ADMISSION: Chest pain and shortness of breath.   HOSPITAL COURSE: The patient is a 74 year old Caucasian female with a history of CAD, hypertension, diabetes, dementia. The patient was reported to have complaints of chest pain and  shortness of breath.   Her healthcare giver called EMS. The patient was given aspirin. She was noted to have elevated troponin at 0.16, so the patient was started on heparin drip and admitted for:   1.  Non ST-elevation myocardial infarction:  We continued to monitor troponin, which has been 0.16, 0.15, 0.15 three times. In addition, the patient has been treated with aspirin and Lipitor, Lopressor. The patient was evaluated by Dr. Kirke Short. Since the patient declined invasive treatment, Dr. Kirke Short suggested medical treatment and add  Plavix. The patient had no chest pain since admission. She denies any other symptoms.   2.  Diabetes: Has been treated with sliding scale.  3.  Hypertension: Has been controlled.    The patient is clinically stable, will be discharged to home today. I discussed the patient's discharge plan with the patient, healthcare giver, Dr. Mariah Short, case manager and nurse.   TIME SPENT: About 33 minutes.   ____________________________ [name] Short PollackQing Brightyn Mozer, MD qc:cb D: 05/31/2013 14:09:42 ET T: 05/31/2013 17:45:54 ET JOB#: [id number]  cc: [name] Short PollackQing [name] Short Hohmann, MD, <Dictator> [name] Short PollackQING Ammie Warrick MD ELECTRONICALLY SIGNED 06/07/2013 13:19

## 2015-02-02 NOTE — H&P (Signed)
PATIENT NAME:  [name] Short, [name] Short MR#:  [id number] DATE OF BIRTH:  1940/10/22  DATE OF ADMISSION:  04/27/2013  PRIMARY CARE PHYSICIAN: Dorothey Baseman, MD  CHIEF COMPLAINT: Altered mental status, severe debility.   HISTORY OF PRESENT ILLNESS: This is a very nice 74 year old female who has history of dementia, depression, coronary artery disease, diabetes, hyperlipidemia who has been evaluated by Dr. Malvin Johns for her dementia. She has recent changes in medications. Her donepezil was increased from 5 mg 10 mg and she was recently started on Celexa. The patient takes hydrochlorothiazide with Benicar for her blood pressure and she has just recently moved over here from [place] for the past eight months to live close to her daughter. They have been on independent living for all their lives her.  She lives with her, but her husband had a transient ischemic attack recently, within the last month, for which now, they have 24-hour care. The patient is started having some and significant changes in her mental status, being agitated, being anxious, having stomach aches and feeling that she was starving but not eating anything despite that. This started on Wednesday, by Friday the patient started to be very weak needed to be fed by hand. By Saturday, she needed to be on full assistance. The patient has been really uncomfortable, moaning and very lethargic. She was being helped to the bathroom last night and she had a fall getting. She did not hurt anything, she was caught by her 24-hour personnel for the care. She had two episodes of diarrhea last night, nausea, and she has been dry heaving, but no vomiting. This had been going on for the past 24 hours. The patient has been very lethargic and at this point, the family is very concerned, not able to manage her at home. Whenever she was evaluated here at the ER, her sodium level is 107 and she is obtunded. The patient is admitted for treatment and evaluation of this  condition.   REVIEW OF SYSTEMS: Unable to obtain due to patient's altered mental status.   PAST MEDICAL HISTORY:   1.  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  The patient is on the Namenda and donepezil.  2.  History of depression.  3.  Coronary artery disease, status post seven stents.  4.  History of  2 MIs, non-ST elevation.  5.  Diabetes, well-controlled.  6.  Hyperlipidemia.   ALLERGIES: Not known drug allergies.   PAST SURGICAL HISTORY: Positive for seven stents.   SOCIAL HISTORY: The patient moved from [place] eight months ago to live close to her daughter. All this information has been not from the patient, but from the daughter, who is at the bedside. The patient was independent living for all her life with her husband, but within the past month, they have transitioned to 24-hour care at their own house due to the patient's husband having a mini stroke. She has never smoked. She does not drink, does not use any drugs.   FAMILY HISTORY: Positive for Alzheimer's dementia in her mom. No CVAs. No MIs. Her father died from complications of lung cancer.   CURRENT MEDICATIONS: Include Namenda 10 mg twice daily, metformin 1000 mg once a day, Lopressor 100 mg twice daily, Lipitor 40 mg once a day, isosorbide, mononitrate 60 mg once a day, donepezil 5 mg once a day, Benicar HCTZ 25 mg/40 mg once daily, aspirin 81 mg once daily, Aldactone 25 mg take 1/2 tablet once a day.   PHYSICAL EXAMINATION: VITAL  SIGNS: Blood pressure is 128/65, pulse 54, respirations 18, temperature 97.6. The patient had some multiple PVCs on her EKG on the monitor. She is not in any significant distress. She is very obtunded though.  HEENT: Pupils are equal, round and reactive. Extraocular movements are intact. Mucosae are moist. Anicteric sclerae. Pink conjunctivae. No oral lesions. No oropharyngeal exudates. No thrush.  NECK: Supple. No JVD. No thyromegaly. No adenopathy. No carotid bruits. No rigidity.   CARDIOVASCULAR: Regular rate and rhythm. No murmurs, rubs, or gallops. No displacement of PMI. No tenderness to palpation anterior chest wall.  LUNGS: Clear without any wheezing or crepitus. No use of accessory muscles. No rhonchi.  ABDOMEN: Soft, nontender, nondistended. No hepatosplenomegaly. No masses. Bowel sounds are positive.  EXTREMITIES: No edema, cyanosis or clubbing. Pulses +2. Capillary refill less than 3.  NEUROLOGICAL: The patient follows minimal commands. She is very obtunded, she opens her eyes to voice, but does not really interact very much. She only follow simple commands like open her mouth, opens her eyes, but other than that she is not are reactive. There is no fasciculations. No active seizures.  Pupils are equal are equal. Extraocular movements: The patient is able to follow and track stone. She moves all extremities equally, but she is very weak. Strength is 3/5 in all four extremities. Sensation: The patient withdraws to pain from four extremities.  LYMPHATICS: Negative for lymphadenopathy in neck or supraclavicular areas.  SKIN: No rashes, petechiae. Skin turgor seems to be normal.  MUSCULOSKELETAL: No significant joint effusions or joint edema.  PSYCHIATRIC: The patient is obtunded, not following commands.  GENITOURINARY: Deferred.  LABORATORY, DIAGNOSTIC AND RADIOLOGIC DATA: Glucose 150, BUN 19, creatinine 1.03, sodium level 107. Previous, sodium level was 139 a month ago potassium level is 2.7, chloride 71. Troponin level is slightly elevated at 0.06, albumin 2.3. White count is 8.1, hemoglobin 12, platelet count 257, INR 1.0.   CT scan of the head: No significant bleeding or signs of new strokes.   ASSESSMENT AND PLAN: A 74 year old female with history of dementia, depression, coronary artery disease prior MIs, diabetes and hyperlipidemia, comes with altered mental status.  1.  Altered mental status. This is likely secondary to her low sodium. We are going to try to  rule out other entities like a pneumonia or urinary tract infection. Actually, her urinalysis did not show any signs of infection and her chest x-ray has not been done. We are going to follow up on those results.  Likely due to his metabolic toxic encephalopathy due to sodium levels being low.  2.  Hyponatremia. The patient has acute hyponatremia. This is likely secondary to change of medications on top of mild dehydration, on top of hydrochlorothiazide use and new use of selective serotonin reuptake inhibitors. The patient has been recently started on Celexa and most of the changes in her mental status happened right after 3 or 4 days for initiation. So this could be secondary to selective serotonin reuptake inhibitor on top of that, she takes hydrochlorothiazide, which we are going to stop. We will monitor closely. X-ray of the chest is going to be checked to evaluate for possibility of lung tumors as the patient has syndrome of inappropriate antidiuretic secretion. The patient is going to be admitted to the Critical Care Unit due to her altered mental status and the need of close monitoring of sodium, She is having severe hyponatremia, which could be complicated with seizures. At this moment, she is not actively seizing, but  we are going to monitor closely. She is going to require sodium levels to be checked every hour frequently and  if her sodium level is corrected with 3% saline solution, we are going to change it up to every two hours and go from there. Nephrology consultation is going to be obtained. We calculated her fluid deficit. For now, we are going to try to go to a goal of 120 in the next 24 hours and for that, she is going to get 40 mL  of 3% sodium solution. Urine sodium osmolarity checked, uric acid checked.  3.  Dementia. Continue oral medications as needed.  4.  Elevated troponin. The patient has history of coronary artery disease. At this moment this is likely secondary to secondary to stress  secondary demand ischemia, but we are going to monitor closely. I am not going to start her on any full anticoagulation, we are just going to do a prophylactic. If troponin continues to up, we are going to start her right away on either Lovenox or heparin drip.  5.  As far as her diabetes, we are going to control her with insulin sliding scale.  6.  Hyperlipidemia. Continue statin as possible.  5.  Severe hypokalemia.  We replace with intravenous potassium and recheck the next couple hours.  Other medical problems seem to be stable.   CODE STATUS:  The patient is a DO NOT RESUSCITATE.   We are going to monitor closely PPI for GI prophylaxis.   TIME SPENT:  I spent about 50 minutes with this patient in critical care time, as the patient has risk of severe seizures, cardiovascular collapse and cardiovascular arrest due to very low sodium.    ____________________________ Felipa Furnace, MD rsg:cc D: 04/27/2013 14:23:28 ET T: 04/27/2013 15:16:39 ET JOB#: [id number]  cc: Felipa Furnace, MD, <Dictator> Jax Abdelrahman Juanda Chance MD ELECTRONICALLY SIGNED 04/28/2013 20:27

## 2015-02-02 NOTE — Discharge Summary (Signed)
PATIENT NAME:  [name] Short, [name] MR#:  [id number] DATE OF BIRTH:  January 27, 1941  DATE OF ADMISSION:  04/27/2013 DATE OF DISCHARGE:    For a detailed note, please take a look at the history and physical done on admission by Dr. Mordecai MaesSanchez.   DIAGNOSES AT DISCHARGE: As follows: 1.  Altered mental status secondary to metabolic encephalopathy from severe hyponatremia. 2.  Hyponatremia likely hypovolemic in nature. 3.  Underlying dementia.  4.  Hypertension.  5.  History of coronary artery disease.  6.  Hyperlipidemia.  7.  Diabetes.   DIET: The patient is being discharged on a low-sodium, low-fat, American Diabetic Association diet.   ACTIVITY: As tolerated.   FOLLOWUP: With the primary care physician at the skilled nursing facility. Also, follow up with Dr. Dorothey Basemanavid Bronstein in the next 1 to 2 weeks.   DISCHARGE MEDICATIONS: Lipitor 40 mg daily, metformin 1000 mg daily, Lopressor 100 mg b.i.d., Namenda 10 mg b.i.d., Imdur 60 mg daily, Aricept 5 mg at bedtime, aspirin 81 mg daily, Tylenol 650 mg  q.4 hours as needed and Benicar 40 mg daily.   CONSULTANTS DURING THE HOSPITAL COURSE: Dr. Mosetta PigeonHarmeet Singh from nephrology.   PERTINENT STUDIES DONE DURING THE HOSPITAL COURSE: As follows: A CT of the head done without contrast on admission showing no acute intracranial process. A chest x-ray done on admission showing no evidence of acute cardiopulmonary disease. A CT chest, abdomen and pelvis done with contrast showing no acute disease of the chest, abdomen or pelvis.   HOSPITAL COURSE: This is a 74 year old female with medical problems as mentioned above, presented to the hospital on 04/27/2013 due to altered mental status and severe hyponatremia.  1.  Altered mental status. This was likely secondary to metabolic encephalopathy from her severe hyponatremia. The patient presented to the hospital with a sodium as low as 107. She had a CT head, which was negative and showed no acute intracranial process. The  patient was initially admitted to the Intensive Care Unit and started on 3% saline, which was shortly taken off and then she was started on just normal saline supplementation. Her sodium since then has significantly improved and normalized now. Her mental status is now back down to baseline. She does have baseline underlying dementia and therefore has baseline confusion at this point.  2.  Hyponatremia: This was likely hypovolemic hyponatremia from poor p.o. intake and dehydration. As mentioned initially, the patient was on 3% saline, but was taken off of it after a few hours. Her sodium has significantly improved since admission. It was 107 on admission and in 07/20 it was 132. She was on diuretics including hydrochlorothiazide and Aldactone, which are currently been discontinued. Nephrology did help assist managing the hyponatremia.  3.  Hypokalemia. This was also secondary to dehydration and poor p.o. intake. Her potassium has since then been supplemented and now is completely normal.  4.  Hypotension. This was likely secondary to dehydration and poor p.o. intake. The patient did not require any IV vasopressors. Her hypotension has significantly improved with IV fluid hydration, but there has been no evidence of any septic or cardiogenic shock.  5.  History of coronary artery disease. The patient acutely has no chest pain. She will continue her aspirin, metoprolol, her statin and Imdur as stated.  6.  Hyperlipidemia. The patient was maintained on her Lipitor. She will resume that.  7.  Diabetes. While in the hospital, she was maintained on sliding scale insulin, although she will resume her metformin upon  discharge.   8.  Dementia. The patient was maintained on her Namenda and Aricept and she will also resume that upon discharge.   CODE STATUS: The patient is a DNI/DNR. Given her severe debilitated state after being evaluated by physical therapy, they thought she would she would benefit from short-term  rehab, which is where she is currently being discharged to.   TIME SPENT ON DISCHARGE: 40 minutes.  ____________________________ Rolly Pancake. Cherlynn Kaiser, MD vjs:aw D: 05/02/2013 12:07:53 ET T: 05/02/2013 13:09:11 ET JOB#: [id number]  cc: Rolly Pancake. Cherlynn Kaiser, MD, <Dictator> Teena Irani. Terance Hart, MD Houston Siren MD ELECTRONICALLY SIGNED 05/02/2013 14:48
